# Patient Record
Sex: Male | Born: 1935 | Race: White | Hispanic: No | Marital: Married | Smoking: Former smoker
Health system: Southern US, Community
[De-identification: ages and names within clinical notes are randomized; demographics above are authoritative.]

## PROBLEM LIST (undated history)

## (undated) DIAGNOSIS — I499 Cardiac arrhythmia, unspecified: Secondary | ICD-10-CM

## (undated) DIAGNOSIS — I509 Heart failure, unspecified: Secondary | ICD-10-CM

## (undated) DIAGNOSIS — I10 Essential (primary) hypertension: Secondary | ICD-10-CM

## (undated) HISTORY — PX: CORONARY STENT PLACEMENT: SHX1402

## (undated) HISTORY — PX: LEG AMPUTATION ABOVE KNEE: SHX117

---

## 1998-09-13 ENCOUNTER — Ambulatory Visit (HOSPITAL_COMMUNITY): Admission: RE | Admit: 1998-09-13 | Discharge: 1998-09-13 | Payer: Self-pay | Admitting: *Deleted

## 1998-09-13 ENCOUNTER — Encounter: Payer: Self-pay | Admitting: *Deleted

## 1999-04-05 ENCOUNTER — Encounter: Payer: Self-pay | Admitting: Emergency Medicine

## 1999-04-05 ENCOUNTER — Emergency Department (HOSPITAL_COMMUNITY): Admission: EM | Admit: 1999-04-05 | Discharge: 1999-04-05 | Payer: Self-pay | Admitting: *Deleted

## 1999-04-06 ENCOUNTER — Encounter: Payer: Self-pay | Admitting: Urology

## 1999-04-06 ENCOUNTER — Inpatient Hospital Stay (HOSPITAL_COMMUNITY): Admission: EM | Admit: 1999-04-06 | Discharge: 1999-04-12 | Payer: Self-pay | Admitting: Emergency Medicine

## 1999-04-08 ENCOUNTER — Encounter: Payer: Self-pay | Admitting: Urology

## 1999-04-11 ENCOUNTER — Encounter: Payer: Self-pay | Admitting: Internal Medicine

## 1999-04-11 ENCOUNTER — Encounter: Payer: Self-pay | Admitting: Urology

## 1999-04-17 ENCOUNTER — Encounter: Payer: Self-pay | Admitting: Urology

## 1999-04-17 ENCOUNTER — Ambulatory Visit (HOSPITAL_COMMUNITY): Admission: RE | Admit: 1999-04-17 | Discharge: 1999-04-17 | Payer: Self-pay | Admitting: Urology

## 1999-04-29 ENCOUNTER — Encounter: Payer: Self-pay | Admitting: Urology

## 1999-04-29 ENCOUNTER — Inpatient Hospital Stay (HOSPITAL_COMMUNITY): Admission: RE | Admit: 1999-04-29 | Discharge: 1999-05-08 | Payer: Self-pay | Admitting: Urology

## 1999-05-01 ENCOUNTER — Encounter: Payer: Self-pay | Admitting: Urology

## 1999-05-02 ENCOUNTER — Encounter (HOSPITAL_BASED_OUTPATIENT_CLINIC_OR_DEPARTMENT_OTHER): Payer: Self-pay | Admitting: Internal Medicine

## 1999-05-21 ENCOUNTER — Encounter (HOSPITAL_COMMUNITY): Admission: RE | Admit: 1999-05-21 | Discharge: 1999-08-19 | Payer: Self-pay | Admitting: Plastic Surgery

## 1999-06-05 ENCOUNTER — Ambulatory Visit: Admission: RE | Admit: 1999-06-05 | Discharge: 1999-06-05 | Payer: Self-pay | Admitting: Internal Medicine

## 1999-08-07 ENCOUNTER — Inpatient Hospital Stay (HOSPITAL_COMMUNITY): Admission: RE | Admit: 1999-08-07 | Discharge: 1999-08-11 | Payer: Self-pay | Admitting: Orthopaedic Surgery

## 2000-05-24 ENCOUNTER — Inpatient Hospital Stay (HOSPITAL_COMMUNITY): Admission: EM | Admit: 2000-05-24 | Discharge: 2000-05-27 | Payer: Self-pay | Admitting: *Deleted

## 2000-09-23 ENCOUNTER — Ambulatory Visit (HOSPITAL_COMMUNITY): Admission: RE | Admit: 2000-09-23 | Discharge: 2000-09-23 | Payer: Self-pay | Admitting: Cardiology

## 2000-11-17 ENCOUNTER — Ambulatory Visit (HOSPITAL_COMMUNITY): Admission: RE | Admit: 2000-11-17 | Discharge: 2000-11-18 | Payer: Self-pay | Admitting: *Deleted

## 2001-03-18 ENCOUNTER — Inpatient Hospital Stay (HOSPITAL_COMMUNITY): Admission: EM | Admit: 2001-03-18 | Discharge: 2001-03-22 | Payer: Self-pay | Admitting: *Deleted

## 2002-08-29 ENCOUNTER — Encounter: Payer: Self-pay | Admitting: Emergency Medicine

## 2002-08-29 ENCOUNTER — Observation Stay (HOSPITAL_COMMUNITY): Admission: EM | Admit: 2002-08-29 | Discharge: 2002-08-30 | Payer: Self-pay | Admitting: Emergency Medicine

## 2002-08-30 ENCOUNTER — Encounter: Payer: Self-pay | Admitting: *Deleted

## 2005-10-24 ENCOUNTER — Inpatient Hospital Stay (HOSPITAL_COMMUNITY): Admission: EM | Admit: 2005-10-24 | Discharge: 2005-11-05 | Payer: Self-pay | Admitting: Emergency Medicine

## 2005-10-24 ENCOUNTER — Ambulatory Visit: Payer: Self-pay | Admitting: Emergency Medicine

## 2005-10-24 ENCOUNTER — Ambulatory Visit: Payer: Self-pay | Admitting: Internal Medicine

## 2005-10-29 ENCOUNTER — Encounter (INDEPENDENT_AMBULATORY_CARE_PROVIDER_SITE_OTHER): Payer: Self-pay | Admitting: Interventional Cardiology

## 2007-12-21 ENCOUNTER — Encounter: Admission: RE | Admit: 2007-12-21 | Discharge: 2007-12-21 | Payer: Self-pay | Admitting: Geriatric Medicine

## 2008-01-11 ENCOUNTER — Encounter: Admission: RE | Admit: 2008-01-11 | Discharge: 2008-01-11 | Payer: Self-pay | Admitting: Urology

## 2008-10-01 ENCOUNTER — Encounter (HOSPITAL_COMMUNITY): Admission: RE | Admit: 2008-10-01 | Discharge: 2008-12-30 | Payer: Self-pay | Admitting: Cardiology

## 2009-05-05 ENCOUNTER — Inpatient Hospital Stay (HOSPITAL_COMMUNITY): Admission: EM | Admit: 2009-05-05 | Discharge: 2009-05-09 | Payer: Self-pay | Admitting: Emergency Medicine

## 2009-05-27 ENCOUNTER — Inpatient Hospital Stay (HOSPITAL_COMMUNITY): Admission: EM | Admit: 2009-05-27 | Discharge: 2009-06-03 | Payer: Self-pay | Admitting: Emergency Medicine

## 2009-05-29 ENCOUNTER — Encounter (INDEPENDENT_AMBULATORY_CARE_PROVIDER_SITE_OTHER): Payer: Self-pay | Admitting: Internal Medicine

## 2009-07-04 ENCOUNTER — Inpatient Hospital Stay (HOSPITAL_COMMUNITY): Admission: EM | Admit: 2009-07-04 | Discharge: 2009-07-09 | Payer: Self-pay | Admitting: Emergency Medicine

## 2009-08-01 ENCOUNTER — Encounter (HOSPITAL_COMMUNITY): Admission: RE | Admit: 2009-08-01 | Discharge: 2009-10-16 | Payer: Self-pay | Admitting: Nephrology

## 2009-08-01 ENCOUNTER — Encounter (INDEPENDENT_AMBULATORY_CARE_PROVIDER_SITE_OTHER): Payer: Self-pay | Admitting: *Deleted

## 2009-12-14 ENCOUNTER — Inpatient Hospital Stay (HOSPITAL_COMMUNITY): Admission: EM | Admit: 2009-12-14 | Discharge: 2009-12-18 | Payer: Self-pay | Admitting: Emergency Medicine

## 2010-01-02 ENCOUNTER — Ambulatory Visit: Payer: Self-pay | Admitting: Family Medicine

## 2010-01-02 ENCOUNTER — Inpatient Hospital Stay (HOSPITAL_COMMUNITY): Admission: EM | Admit: 2010-01-02 | Discharge: 2010-01-09 | Payer: Self-pay | Admitting: Emergency Medicine

## 2010-01-02 ENCOUNTER — Ambulatory Visit: Payer: Self-pay | Admitting: Vascular Surgery

## 2010-01-02 ENCOUNTER — Encounter (INDEPENDENT_AMBULATORY_CARE_PROVIDER_SITE_OTHER): Payer: Self-pay | Admitting: Emergency Medicine

## 2010-01-17 ENCOUNTER — Encounter (INDEPENDENT_AMBULATORY_CARE_PROVIDER_SITE_OTHER): Payer: Self-pay | Admitting: Cardiology

## 2010-01-17 ENCOUNTER — Emergency Department (HOSPITAL_COMMUNITY): Admission: EM | Admit: 2010-01-17 | Discharge: 2010-01-17 | Payer: Self-pay | Admitting: Emergency Medicine

## 2010-01-17 ENCOUNTER — Ambulatory Visit: Payer: Self-pay | Admitting: Internal Medicine

## 2010-03-26 ENCOUNTER — Inpatient Hospital Stay (HOSPITAL_COMMUNITY)
Admission: EM | Admit: 2010-03-26 | Discharge: 2010-03-29 | Payer: Self-pay | Source: Home / Self Care | Attending: Internal Medicine | Admitting: Internal Medicine

## 2010-03-30 ENCOUNTER — Encounter: Payer: Self-pay | Admitting: Geriatric Medicine

## 2010-03-31 ENCOUNTER — Encounter: Payer: Self-pay | Admitting: Orthopaedic Surgery

## 2010-03-31 LAB — BASIC METABOLIC PANEL
CO2: 25 mEq/L (ref 19–32)
CO2: 27 mEq/L (ref 19–32)
Calcium: 8 mg/dL — ABNORMAL LOW (ref 8.4–10.5)
Chloride: 110 mEq/L (ref 96–112)
Chloride: 109 mEq/L (ref 96–112)
Creatinine, Ser: 2.23 mg/dL — ABNORMAL HIGH (ref 0.4–1.5)
Creatinine, Ser: 2.33 mg/dL — ABNORMAL HIGH (ref 0.4–1.5)
GFR calc Af Amer: 34 mL/min — ABNORMAL LOW (ref >60)
Glucose, Bld: 41 mg/dL — CL (ref 70–99)
Glucose, Bld: 193 mg/dL — ABNORMAL HIGH (ref 70–99)

## 2010-03-31 LAB — CARDIAC PANEL(CRET KIN+CKTOT+MB+TROPI)
CK, MB: 4.3 ng/mL — ABNORMAL HIGH (ref 0.3–4.0)
Relative Index: INVALID (ref 0.0–2.5)
Relative Index: INVALID (ref 0.0–2.5)
Total CK: 32 U/L (ref 7–232)
Troponin I: 0.03 ng/mL (ref 0.00–0.06)

## 2010-03-31 LAB — COMPREHENSIVE METABOLIC PANEL
Albumin: 2 g/dL — ABNORMAL LOW (ref 3.5–5.2)
BUN: 59 mg/dL — ABNORMAL HIGH (ref 6–23)
Calcium: 7.9 mg/dL — ABNORMAL LOW (ref 8.4–10.5)
Creatinine, Ser: 2.27 mg/dL — ABNORMAL HIGH (ref 0.4–1.5)
GFR calc Af Amer: 33 mL/min — ABNORMAL LOW (ref >60)
Total Bilirubin: 0.3 mg/dL (ref 0.3–1.2)
Total Protein: 5.9 g/dL — ABNORMAL LOW (ref 6.0–8.3)

## 2010-03-31 LAB — GLUCOSE, CAPILLARY
Glucose-Capillary: 171 mg/dL — ABNORMAL HIGH (ref 70–99)
Glucose-Capillary: 130 mg/dL — ABNORMAL HIGH (ref 70–99)
Glucose-Capillary: 187 mg/dL — ABNORMAL HIGH (ref 70–99)
Glucose-Capillary: 201 mg/dL — ABNORMAL HIGH (ref 70–99)
Glucose-Capillary: 193 mg/dL — ABNORMAL HIGH (ref 70–99)

## 2010-03-31 LAB — DIFFERENTIAL
Basophils Absolute: 0.2 10*3/uL — ABNORMAL HIGH (ref 0.0–0.1)
Lymphocytes Relative: 15 % (ref 12–46)
Lymphs Abs: 1.5 10*3/uL (ref 0.7–4.0)
Neutro Abs: 7.1 10*3/uL (ref 1.7–7.7)

## 2010-03-31 LAB — CBC
HCT: 28.3 % — ABNORMAL LOW (ref 39.0–52.0)
Hemoglobin: 9.1 g/dL — ABNORMAL LOW (ref 13.0–17.0)
MCH: 31.9 pg (ref 26.0–34.0)
MCHC: 32.4 g/dL (ref 30.0–36.0)
MCV: 97.3 fL (ref 78.0–100.0)
Platelets: 234 10*3/uL (ref 150–400)
RDW: 15.3 % (ref 11.5–15.5)
WBC: 9.8 10*3/uL (ref 4.0–10.5)

## 2010-03-31 LAB — MAGNESIUM: Magnesium: 1.9 mg/dL (ref 1.5–2.5)

## 2010-03-31 LAB — CK TOTAL AND CKMB (NOT AT ARMC)
CK, MB: 4.6 ng/mL — ABNORMAL HIGH (ref 0.3–4.0)
Relative Index: INVALID (ref 0.0–2.5)
Total CK: 37 U/L (ref 7–232)

## 2010-03-31 LAB — PROTIME-INR: INR: 2.53 — ABNORMAL HIGH (ref 0.00–1.49)

## 2010-03-31 LAB — BRAIN NATRIURETIC PEPTIDE: Pro B Natriuretic peptide (BNP): 900 pg/mL — ABNORMAL HIGH (ref 0.0–100.0)

## 2010-04-01 LAB — BASIC METABOLIC PANEL
CO2: 29 mEq/L (ref 19–32)
Calcium: 7.8 mg/dL — ABNORMAL LOW (ref 8.4–10.5)
Chloride: 105 mEq/L (ref 96–112)
GFR calc Af Amer: 33 mL/min — ABNORMAL LOW (ref >60)
Glucose, Bld: 132 mg/dL — ABNORMAL HIGH (ref 70–99)
Sodium: 140 mEq/L (ref 135–145)

## 2010-04-01 LAB — GLUCOSE, CAPILLARY: Glucose-Capillary: 102 mg/dL — ABNORMAL HIGH (ref 70–99)

## 2010-04-01 LAB — PROTIME-INR: INR: 1.74 — ABNORMAL HIGH (ref 0.00–1.49)

## 2010-04-07 NOTE — Discharge Summary (Addendum)
NAME:  William French, William French             ACCOUNT NO.:  [id number]  MEDICAL RECORD NO.:  [id number]          PATIENT TYPE:  INP  LOCATION:  4703                         FACILITY:  [facility]  PHYSICIAN:  Peggye Pitt, M.D. DATE OF BIRTH:  1923/03/21  DATE OF ADMISSION:  03/26/2010 DATE OF DISCHARGE:  03/29/2010                              DISCHARGE SUMMARY   PRIMARY CARE PHYSICIAN:  Drue Dun. Tripp, MD  DISCHARGE DIAGNOSES: 1. Acute-on-chronic diastolic congestive heart failure exacerbation,     believed secondary to dietary indiscretion. 2. 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 3. Recent history of pulmonary embolism. 4. Paroxysmal atrial fibrillation, rate controlled. 5. Diabetes mellitus type 2. 6. Status post left above-knee amputation. 7. Hypothyroidism. 8. Status post right intertrochanteric hip fracture, status post     repair in October 2011.  DISCHARGE MEDICATIONS: 1. Ensure twice daily. 2. Lasix 40 mg twice daily. 3. Multivitamin 1 tablet daily. 4. Norvasc 10 mg daily. 5. Aspirin 325 mg daily. 6. Regular insulin sliding scale per his PCP. 7. Hydralazine 25 mg 3 times a day. 8. Synthroid 125 mcg daily. 9. Lantus 60 units subcutaneously in the morning. 10.Labetalol 5 mg daily. 11.Oxycodone 5 mg 1 tablet daily as needed for pain. 12.Protonix 40 mg 2 tablets daily. 13.Warfarin 1 mg to take half a tablet daily.  DISPOSITION AND FOLLOWUP:  William French will be discharged home today in stable condition.  He has been given instructions regarding low-salt diet.  He is to follow up with his primary care physician, Dr. Florentina Jenny in approximately 2 weeks after discharge for followup on INR levels as well as CHF.  William French will be discharged on his home regimen of O2, which is 2-2.5 liters.  CONSULTATIONS:  None.  IMAGES AND PROCEDURES:  A chest x-ray on March 26, 2010, that showed CHF with pleural effusions.  Repeat chest x-ray on March 28, 2010, that showed stable  bilateral effusions with improvement in his CHF.  HISTORY AND PHYSICAL:  For full details, see dictation by Dr. Berkley Harvey on March 26, 2010, but in brief, William French is a pleasant 77 year old Caucasian gentleman with a history of diastolic CHF who presented to the hospital with worsening shortness of breath.  He was recently admitted to our hospital in November for an acute decompensation of his diastolic CHF.  He states that the shortness of breath is very apparent even with minimal exertion, such as getting on the chair.  He is wheelchair bound secondary to a left amputation and a right hip fracture.  He does not have any chest pain.  Because of these issues, we were asked to admit him for further evaluation.  HOSPITAL COURSE BY PROBLEM: 1. Acute-on-chronic diastolic CHF.  His Lasix dose was increased while     in the hospital.  He has diuresed approximately 6 liters and has     lost 5 kg since admission.  He has been given information on the     medication compliance and a diet adherence. 2. A Stage III to IV chronic kidney disease.  His creatinine has     maintained at his baseline between 2.2  and 2.3. 3. For his recent PE, he is maintained on Coumadin. 4. For his atrial fibrillation, he is rate controlled.  Please note     that he is on Coumadin for his PE, but after his 6 months are     completed, he has already been deemed an appropriate long-term     Coumadin candidate secondary to his mobility status in regard to is     AFib. 5. For his diabetes, his sugars have been well controlled while in the     hospital, his home regimen of Lantus 6 units on sliding scale. 6. All the rest of his chronic conditions have been stable.  VITAL SIGNS ON DAY OF DISCHARGE:  Blood pressure 167/60, heart rate 65, respirations 10, sats of 97% on 2 liters with a temperature of 98.1.     Peggye Pitt, M.D.     EH/MEDQ  D:  03/29/2010  T:  03/30/2010  Job:  [id number]  cc:   Drue Dun. [employer],  MD  Electronically Signed by Peggye Pitt M.D. on 04/07/2010 07:57:17 AM

## 2010-04-10 NOTE — Letter (Signed)
Summary: New Patient letter  [facility]  [address] [place], [place] [address]   Phone: [phone]  Fax: [phone]       08/01/2009 MRN: [id number]  William French [address] [place], [place]  [address]  Dear Mr. Lamarca,  Welcome to the Gastroenterology Division at [facility].    You are scheduled to see Dr.  Christella Hartigan on 09-06-09 at 10:30a.m. on the 3rd floor at [facility], [address].  We ask that you try to arrive at our office 15 minutes prior to your appointment time to allow for check-in.  We would like you to complete the enclosed self-administered evaluation form prior to your visit and bring it with you on the day of your appointment.  We will review it with you.  Also, please bring a complete list of all your medications or, if you prefer, bring the medication bottles and we will list them.  Please bring your insurance card so that we may make a copy of it.  If your insurance requires a referral to see a specialist, please bring your referral form from your primary care physician.  Co-payments are due at the time of your visit and may be paid by cash, check or credit card.     Your office visit will consist of a consult with your physician (includes a physical exam), any laboratory testing he/she may order, scheduling of any necessary diagnostic testing (e.g. x-ray, ultrasound, CT-scan), and scheduling of a procedure (e.g. Endoscopy, Colonoscopy) if required.  Please allow enough time on your schedule to allow for any/all of these possibilities.    If you cannot keep your appointment, please call [phone] to cancel or reschedule prior to your appointment date.  This allows [place] the opportunity to schedule an appointment for another patient in need of care.  If you do not cancel or reschedule by 5 p.m. the business day prior to your appointment date, you will be charged a $50.00 late cancellation/no-show fee.    Thank you for choosing  [facility] for your medical needs.  We appreciate the opportunity to care for you.  Please visit [place] at our website  to learn more about our practice.                     Sincerely,                                                             The Gastroenterology Division

## 2010-05-07 NOTE — Discharge Summary (Signed)
  NAME:  William French, William French             ACCOUNT NO.:  [id number]  MEDICAL RECORD NO.:  [id number]          PATIENT TYPE:  INP  LOCATION:  4703                         FACILITY:  [facility]  PHYSICIAN:  Peggye Pitt, M.D. DATE OF BIRTH:  November 02, 1923  DATE OF ADMISSION:  03/26/2010 DATE OF DISCHARGE:  03/29/2010                              DISCHARGE SUMMARY   ADDENDUM Medication #10 that currently reads as labetalol 5 mg daily, should instead be nebivolol 5 mg daily.  Rest of discharge diagnosis medications remains as previously dictated summary.     Peggye Pitt, M.D.     EH/MEDQ  D:  04/29/2010  T:  04/30/2010  Job:  [id number]  Electronically Signed by Peggye Pitt M.D. on 05/07/2010 08:20:43 AM

## 2010-05-20 LAB — CBC
HCT: 28.2 % — ABNORMAL LOW (ref 39.0–52.0)
HCT: 30 % — ABNORMAL LOW (ref 39.0–52.0)
Hemoglobin: 9 g/dL — ABNORMAL LOW (ref 13.0–17.0)
Hemoglobin: 9.2 g/dL — ABNORMAL LOW (ref 13.0–17.0)
MCH: 31.1 pg (ref 26.0–34.0)
MCHC: 31.9 g/dL (ref 30.0–36.0)
MCHC: 32.1 g/dL (ref 30.0–36.0)
MCV: 97.6 fL (ref 78.0–100.0)
MCV: 99.3 fL (ref 78.0–100.0)
RBC: 3.02 MIL/uL — ABNORMAL LOW (ref 4.22–5.81)
RDW: 17 % — ABNORMAL HIGH (ref 11.5–15.5)
RDW: 17 % — ABNORMAL HIGH (ref 11.5–15.5)
RDW: 17.1 % — ABNORMAL HIGH (ref 11.5–15.5)
WBC: 8.8 10*3/uL (ref 4.0–10.5)
WBC: 9.7 10*3/uL (ref 4.0–10.5)

## 2010-05-20 LAB — BASIC METABOLIC PANEL
BUN: 42 mg/dL — ABNORMAL HIGH (ref 6–23)
BUN: 46 mg/dL — ABNORMAL HIGH (ref 6–23)
BUN: 49 mg/dL — ABNORMAL HIGH (ref 6–23)
CO2: 23 mEq/L (ref 19–32)
Calcium: 7.7 mg/dL — ABNORMAL LOW (ref 8.4–10.5)
Chloride: 110 mEq/L (ref 96–112)
Chloride: 117 mEq/L — ABNORMAL HIGH (ref 96–112)
Creatinine, Ser: 2.37 mg/dL — ABNORMAL HIGH (ref 0.4–1.5)
GFR calc Af Amer: 32 mL/min — ABNORMAL LOW (ref >60)
GFR calc Af Amer: 35 mL/min — ABNORMAL LOW (ref >60)
GFR calc non Af Amer: 26 mL/min — ABNORMAL LOW (ref >60)
GFR calc non Af Amer: 27 mL/min — ABNORMAL LOW (ref >60)
GFR calc non Af Amer: 29 mL/min — ABNORMAL LOW (ref >60)
Glucose, Bld: 142 mg/dL — ABNORMAL HIGH (ref 70–99)
Potassium: 4.4 mEq/L (ref 3.5–5.1)
Potassium: 4.5 mEq/L (ref 3.5–5.1)
Sodium: 141 mEq/L (ref 135–145)
Sodium: 141 mEq/L (ref 135–145)

## 2010-05-20 LAB — PROTIME-INR
INR: 1.92 — ABNORMAL HIGH (ref 0.00–1.49)
INR: 2.01 — ABNORMAL HIGH (ref 0.00–1.49)
INR: 2.18 — ABNORMAL HIGH (ref 0.00–1.49)
Prothrombin Time: 22.1 seconds — ABNORMAL HIGH (ref 11.6–15.2)
Prothrombin Time: 29.6 seconds — ABNORMAL HIGH (ref 11.6–15.2)

## 2010-05-20 LAB — POCT I-STAT, CHEM 8
Calcium, Ion: 1.08 mmol/L — ABNORMAL LOW (ref 1.12–1.32)
HCT: 30 % — ABNORMAL LOW (ref 39.0–52.0)
Hemoglobin: 10.2 g/dL — ABNORMAL LOW (ref 13.0–17.0)
Sodium: 141 mEq/L (ref 135–145)
TCO2: 23 mmol/L (ref 0–100)

## 2010-05-20 LAB — GLUCOSE, CAPILLARY
Glucose-Capillary: 182 mg/dL — ABNORMAL HIGH (ref 70–99)
Glucose-Capillary: 192 mg/dL — ABNORMAL HIGH (ref 70–99)
Glucose-Capillary: 224 mg/dL — ABNORMAL HIGH (ref 70–99)
Glucose-Capillary: 273 mg/dL — ABNORMAL HIGH (ref 70–99)
Glucose-Capillary: 387 mg/dL — ABNORMAL HIGH (ref 70–99)

## 2010-05-20 LAB — DIFFERENTIAL
Basophils Absolute: 0.2 10*3/uL — ABNORMAL HIGH (ref 0.0–0.1)
Basophils Relative: 2 % — ABNORMAL HIGH (ref 0–1)
Lymphocytes Relative: 17 % (ref 12–46)
Monocytes Relative: 10 % (ref 3–12)
Neutro Abs: 5.9 10*3/uL (ref 1.7–7.7)
Neutrophils Relative %: 67 % (ref 43–77)

## 2010-05-20 LAB — POCT CARDIAC MARKERS

## 2010-05-20 LAB — CK TOTAL AND CKMB (NOT AT ARMC)
CK, MB: 3.8 ng/mL (ref 0.3–4.0)
Relative Index: INVALID (ref 0.0–2.5)
Total CK: 28 U/L (ref 7–232)

## 2010-05-20 LAB — BRAIN NATRIURETIC PEPTIDE: Pro B Natriuretic peptide (BNP): 1209 pg/mL — ABNORMAL HIGH (ref 0.0–100.0)

## 2010-05-20 LAB — TROPONIN I: Troponin I: 0.02 ng/mL (ref 0.00–0.06)

## 2010-05-21 LAB — GLUCOSE, CAPILLARY
Glucose-Capillary: 113 mg/dL — ABNORMAL HIGH (ref 70–99)
Glucose-Capillary: 131 mg/dL — ABNORMAL HIGH (ref 70–99)
Glucose-Capillary: 134 mg/dL — ABNORMAL HIGH (ref 70–99)
Glucose-Capillary: 136 mg/dL — ABNORMAL HIGH (ref 70–99)
Glucose-Capillary: 143 mg/dL — ABNORMAL HIGH (ref 70–99)
Glucose-Capillary: 148 mg/dL — ABNORMAL HIGH (ref 70–99)
Glucose-Capillary: 158 mg/dL — ABNORMAL HIGH (ref 70–99)
Glucose-Capillary: 208 mg/dL — ABNORMAL HIGH (ref 70–99)
Glucose-Capillary: 213 mg/dL — ABNORMAL HIGH (ref 70–99)
Glucose-Capillary: 81 mg/dL (ref 70–99)
Glucose-Capillary: 92 mg/dL (ref 70–99)
Glucose-Capillary: 120 mg/dL — ABNORMAL HIGH (ref 70–99)
Glucose-Capillary: 123 mg/dL — ABNORMAL HIGH (ref 70–99)
Glucose-Capillary: 127 mg/dL — ABNORMAL HIGH (ref 70–99)
Glucose-Capillary: 137 mg/dL — ABNORMAL HIGH (ref 70–99)
Glucose-Capillary: 169 mg/dL — ABNORMAL HIGH (ref 70–99)
Glucose-Capillary: 176 mg/dL — ABNORMAL HIGH (ref 70–99)
Glucose-Capillary: 185 mg/dL — ABNORMAL HIGH (ref 70–99)
Glucose-Capillary: 202 mg/dL — ABNORMAL HIGH (ref 70–99)
Glucose-Capillary: 203 mg/dL — ABNORMAL HIGH (ref 70–99)
Glucose-Capillary: 55 mg/dL — ABNORMAL LOW (ref 70–99)
Glucose-Capillary: 86 mg/dL (ref 70–99)

## 2010-05-21 LAB — DIFFERENTIAL
Basophils Absolute: 0.3 10*3/uL — ABNORMAL HIGH (ref 0.0–0.1)
Basophils Relative: 1 % (ref 0–1)
Eosinophils Absolute: 0.2 10*3/uL (ref 0.0–0.7)
Eosinophils Relative: 4 % (ref 0–5)
Eosinophils Relative: 3 % (ref 0–5)
Lymphocytes Relative: 16 % (ref 12–46)
Lymphs Abs: 1.3 10*3/uL (ref 0.7–4.0)
Monocytes Absolute: 1.2 10*3/uL — ABNORMAL HIGH (ref 0.1–1.0)
Monocytes Relative: 13 % — ABNORMAL HIGH (ref 3–12)
Neutro Abs: 7 10*3/uL (ref 1.7–7.7)
Neutrophils Relative %: 69 % (ref 43–77)
Neutrophils Relative %: 71 % (ref 43–77)

## 2010-05-21 LAB — CBC
HCT: 23.8 % — ABNORMAL LOW (ref 39.0–52.0)
HCT: 27.3 % — ABNORMAL LOW (ref 39.0–52.0)
HCT: 27.9 % — ABNORMAL LOW (ref 39.0–52.0)
Hemoglobin: 9 g/dL — ABNORMAL LOW (ref 13.0–17.0)
Hemoglobin: 7.5 g/dL — ABNORMAL LOW (ref 13.0–17.0)
MCH: 31.4 pg (ref 26.0–34.0)
MCH: 31.6 pg (ref 26.0–34.0)
MCH: 32.1 pg (ref 26.0–34.0)
MCH: 31.6 pg (ref 26.0–34.0)
MCHC: 32.1 g/dL (ref 30.0–36.0)
MCHC: 33.2 g/dL (ref 30.0–36.0)
MCV: 97.9 fL (ref 78.0–100.0)
MCV: 98.9 fL (ref 78.0–100.0)
MCV: 99.6 fL (ref 78.0–100.0)
MCV: 99.6 fL (ref 78.0–100.0)
MCV: 95.4 fL (ref 78.0–100.0)
Platelets: 264 10*3/uL (ref 150–400)
Platelets: 291 10*3/uL (ref 150–400)
Platelets: 322 10*3/uL (ref 150–400)
Platelets: 410 10*3/uL — ABNORMAL HIGH (ref 150–400)
Platelets: 257 10*3/uL (ref 150–400)
RBC: 2.39 MIL/uL — ABNORMAL LOW (ref 4.22–5.81)
RBC: 2.75 MIL/uL — ABNORMAL LOW (ref 4.22–5.81)
RBC: 2.37 MIL/uL — ABNORMAL LOW (ref 4.22–5.81)
RDW: 16.3 % — ABNORMAL HIGH (ref 11.5–15.5)
RDW: 16.9 % — ABNORMAL HIGH (ref 11.5–15.5)
RDW: 17 % — ABNORMAL HIGH (ref 11.5–15.5)
RDW: 14.8 % (ref 11.5–15.5)
WBC: 10.1 10*3/uL (ref 4.0–10.5)
WBC: 7.4 10*3/uL (ref 4.0–10.5)
WBC: 8 10*3/uL (ref 4.0–10.5)
WBC: 8.3 10*3/uL (ref 4.0–10.5)
WBC: 8.7 10*3/uL (ref 4.0–10.5)
WBC: 10.4 10*3/uL (ref 4.0–10.5)

## 2010-05-21 LAB — BASIC METABOLIC PANEL
BUN: 48 mg/dL — ABNORMAL HIGH (ref 6–23)
BUN: 50 mg/dL — ABNORMAL HIGH (ref 6–23)
BUN: 54 mg/dL — ABNORMAL HIGH (ref 6–23)
BUN: 61 mg/dL — ABNORMAL HIGH (ref 6–23)
BUN: 68 mg/dL — ABNORMAL HIGH (ref 6–23)
CO2: 20 mEq/L (ref 19–32)
CO2: 22 mEq/L (ref 19–32)
CO2: 18 mEq/L — ABNORMAL LOW (ref 19–32)
CO2: 19 mEq/L (ref 19–32)
Calcium: 7.6 mg/dL — ABNORMAL LOW (ref 8.4–10.5)
Calcium: 7.2 mg/dL — ABNORMAL LOW (ref 8.4–10.5)
Calcium: 7.2 mg/dL — ABNORMAL LOW (ref 8.4–10.5)
Calcium: 7.3 mg/dL — ABNORMAL LOW (ref 8.4–10.5)
Chloride: 115 mEq/L — ABNORMAL HIGH (ref 96–112)
Chloride: 117 mEq/L — ABNORMAL HIGH (ref 96–112)
Chloride: 117 mEq/L — ABNORMAL HIGH (ref 96–112)
Chloride: 108 mEq/L (ref 96–112)
Creatinine, Ser: 2.54 mg/dL — ABNORMAL HIGH (ref 0.4–1.5)
Creatinine, Ser: 2.61 mg/dL — ABNORMAL HIGH (ref 0.4–1.5)
Creatinine, Ser: 2.65 mg/dL — ABNORMAL HIGH (ref 0.4–1.5)
Creatinine, Ser: 2.93 mg/dL — ABNORMAL HIGH (ref 0.4–1.5)
GFR calc Af Amer: 28 mL/min — ABNORMAL LOW (ref >60)
GFR calc non Af Amer: 23 mL/min — ABNORMAL LOW (ref >60)
GFR calc non Af Amer: 21 mL/min — ABNORMAL LOW (ref >60)
GFR calc non Af Amer: 22 mL/min — ABNORMAL LOW (ref >60)
Glucose, Bld: 144 mg/dL — ABNORMAL HIGH (ref 70–99)
Glucose, Bld: 93 mg/dL (ref 70–99)
Potassium: 4.3 mEq/L (ref 3.5–5.1)
Potassium: 3.9 mEq/L (ref 3.5–5.1)
Sodium: 132 mEq/L — ABNORMAL LOW (ref 135–145)

## 2010-05-21 LAB — URINALYSIS, ROUTINE W REFLEX MICROSCOPIC
Bilirubin Urine: NEGATIVE
Glucose, UA: NEGATIVE mg/dL
Ketones, ur: NEGATIVE mg/dL
Protein, ur: 30 mg/dL — AB
Urobilinogen, UA: 0.2 mg/dL (ref 0.0–1.0)

## 2010-05-21 LAB — CROSSMATCH
ABO/RH(D): A POS
Antibody Screen: NEGATIVE

## 2010-05-21 LAB — CARDIAC PANEL(CRET KIN+CKTOT+MB+TROPI)
Relative Index: 2.4 (ref 0.0–2.5)
Total CK: 311 U/L — ABNORMAL HIGH (ref 7–232)
Total CK: 318 U/L — ABNORMAL HIGH (ref 7–232)
Troponin I: 0.05 ng/mL (ref 0.00–0.06)
Troponin I: 0.05 ng/mL (ref 0.00–0.06)

## 2010-05-21 LAB — TSH
TSH: 0.628 u[IU]/mL (ref 0.350–4.500)
TSH: 1.01 u[IU]/mL (ref 0.350–4.500)

## 2010-05-21 LAB — PROTIME-INR
INR: 1.34 (ref 0.00–1.49)
Prothrombin Time: 16.8 seconds — ABNORMAL HIGH (ref 11.6–15.2)
Prothrombin Time: 17.6 seconds — ABNORMAL HIGH (ref 11.6–15.2)

## 2010-05-21 LAB — MAGNESIUM: Magnesium: 1.8 mg/dL (ref 1.5–2.5)

## 2010-05-21 LAB — URINE CULTURE: Culture  Setup Time: 201110090139

## 2010-05-21 LAB — VITAMIN B12: Vitamin B-12: 1121 pg/mL — ABNORMAL HIGH (ref 211–911)

## 2010-05-21 LAB — TYPE AND SCREEN: Antibody Screen: NEGATIVE

## 2010-05-21 LAB — IRON AND TIBC
Iron: <10 ug/dL — ABNORMAL LOW (ref 42–135)
TIBC: 92 ug/dL — ABNORMAL LOW (ref 215–435)
UIBC: 66 ug/dL
UIBC: 85 ug/dL

## 2010-05-21 LAB — COMPREHENSIVE METABOLIC PANEL
ALT: 17 U/L (ref 0–53)
Albumin: 1.5 g/dL — ABNORMAL LOW (ref 3.5–5.2)
Alkaline Phosphatase: 71 U/L (ref 39–117)
BUN: 69 mg/dL — ABNORMAL HIGH (ref 6–23)
Potassium: 4 mEq/L (ref 3.5–5.1)
Sodium: 137 mEq/L (ref 135–145)
Total Protein: 5.3 g/dL — ABNORMAL LOW (ref 6.0–8.3)

## 2010-05-21 LAB — APTT: aPTT: 34 seconds (ref 24–37)

## 2010-05-21 LAB — TROPONIN I: Troponin I: 0.06 ng/mL (ref 0.00–0.06)

## 2010-05-21 LAB — POCT I-STAT, CHEM 8
BUN: 58 mg/dL — ABNORMAL HIGH (ref 6–23)
Calcium, Ion: 1.13 mmol/L (ref 1.12–1.32)
Chloride: 115 mEq/L — ABNORMAL HIGH (ref 96–112)
Creatinine, Ser: 2.6 mg/dL — ABNORMAL HIGH (ref 0.4–1.5)

## 2010-05-21 LAB — FOLATE
Folate: >20 ng/mL
Folate: >20 ng/mL

## 2010-05-21 LAB — RETICULOCYTES: Retic Count, Absolute: 52 10*3/uL (ref 19.0–186.0)

## 2010-05-21 LAB — CK TOTAL AND CKMB (NOT AT ARMC): Total CK: 278 U/L — ABNORMAL HIGH (ref 7–232)

## 2010-05-21 LAB — URINE MICROSCOPIC-ADD ON

## 2010-05-21 LAB — PREPARE RBC (CROSSMATCH)

## 2010-05-21 LAB — FERRITIN
Ferritin: 204 ng/mL (ref 22–322)
Ferritin: 223 ng/mL (ref 22–322)

## 2010-05-21 LAB — HEMOGLOBIN A1C: Mean Plasma Glucose: 111 mg/dL (ref <117)

## 2010-05-27 LAB — PROTIME-INR
INR: 3.21 — ABNORMAL HIGH (ref 0.00–1.49)
INR: 3.39 — ABNORMAL HIGH (ref 0.00–1.49)
INR: 3.49 — ABNORMAL HIGH (ref 0.00–1.49)
Prothrombin Time: 42.7 seconds — ABNORMAL HIGH (ref 11.6–15.2)
Prothrombin Time: 34 seconds — ABNORMAL HIGH (ref 11.6–15.2)
Prothrombin Time: 34.8 seconds — ABNORMAL HIGH (ref 11.6–15.2)

## 2010-05-27 LAB — GLUCOSE, CAPILLARY
Glucose-Capillary: 105 mg/dL — ABNORMAL HIGH (ref 70–99)
Glucose-Capillary: 126 mg/dL — ABNORMAL HIGH (ref 70–99)
Glucose-Capillary: 127 mg/dL — ABNORMAL HIGH (ref 70–99)
Glucose-Capillary: 128 mg/dL — ABNORMAL HIGH (ref 70–99)
Glucose-Capillary: 129 mg/dL — ABNORMAL HIGH (ref 70–99)
Glucose-Capillary: 149 mg/dL — ABNORMAL HIGH (ref 70–99)
Glucose-Capillary: 162 mg/dL — ABNORMAL HIGH (ref 70–99)
Glucose-Capillary: 70 mg/dL (ref 70–99)
Glucose-Capillary: 120 mg/dL — ABNORMAL HIGH (ref 70–99)
Glucose-Capillary: 120 mg/dL — ABNORMAL HIGH (ref 70–99)
Glucose-Capillary: 139 mg/dL — ABNORMAL HIGH (ref 70–99)
Glucose-Capillary: 191 mg/dL — ABNORMAL HIGH (ref 70–99)
Glucose-Capillary: 247 mg/dL — ABNORMAL HIGH (ref 70–99)
Glucose-Capillary: 99 mg/dL (ref 70–99)

## 2010-05-27 LAB — BASIC METABOLIC PANEL
BUN: 48 mg/dL — ABNORMAL HIGH (ref 6–23)
BUN: 48 mg/dL — ABNORMAL HIGH (ref 6–23)
CO2: 19 mEq/L (ref 19–32)
Calcium: 8 mg/dL — ABNORMAL LOW (ref 8.4–10.5)
Calcium: 8.1 mg/dL — ABNORMAL LOW (ref 8.4–10.5)
Chloride: 116 mEq/L — ABNORMAL HIGH (ref 96–112)
Chloride: 110 mEq/L (ref 96–112)
Creatinine, Ser: 2.87 mg/dL — ABNORMAL HIGH (ref 0.4–1.5)
Creatinine, Ser: 2.87 mg/dL — ABNORMAL HIGH (ref 0.4–1.5)
GFR calc Af Amer: 27 mL/min — ABNORMAL LOW (ref >60)
GFR calc Af Amer: 26 mL/min — ABNORMAL LOW (ref >60)
GFR calc Af Amer: 29 mL/min — ABNORMAL LOW (ref >60)
GFR calc non Af Amer: 23 mL/min — ABNORMAL LOW (ref >60)
GFR calc non Af Amer: 21 mL/min — ABNORMAL LOW (ref >60)
GFR calc non Af Amer: 24 mL/min — ABNORMAL LOW (ref >60)
Glucose, Bld: 115 mg/dL — ABNORMAL HIGH (ref 70–99)
Glucose, Bld: 151 mg/dL — ABNORMAL HIGH (ref 70–99)
Potassium: 3.7 mEq/L (ref 3.5–5.1)
Potassium: 3.8 mEq/L (ref 3.5–5.1)
Potassium: 4.2 mEq/L (ref 3.5–5.1)
Sodium: 140 mEq/L (ref 135–145)
Sodium: 140 mEq/L (ref 135–145)

## 2010-05-27 LAB — URINALYSIS, ROUTINE W REFLEX MICROSCOPIC
Glucose, UA: NEGATIVE mg/dL
Ketones, ur: NEGATIVE mg/dL
Protein, ur: 30 mg/dL — AB
pH: 7 (ref 5.0–8.0)

## 2010-05-27 LAB — COMPREHENSIVE METABOLIC PANEL
ALT: 12 U/L (ref 0–53)
AST: 16 U/L (ref 0–37)
Albumin: 2.6 g/dL — ABNORMAL LOW (ref 3.5–5.2)
Alkaline Phosphatase: 51 U/L (ref 39–117)
Chloride: 111 mEq/L (ref 96–112)
GFR calc Af Amer: 29 mL/min — ABNORMAL LOW (ref >60)
Potassium: 3.6 mEq/L (ref 3.5–5.1)
Sodium: 137 mEq/L (ref 135–145)
Total Protein: 5.8 g/dL — ABNORMAL LOW (ref 6.0–8.3)

## 2010-05-27 LAB — URINE MICROSCOPIC-ADD ON

## 2010-05-27 LAB — CULTURE, BLOOD (ROUTINE X 2)
Culture: NO GROWTH
Culture: NO GROWTH

## 2010-05-27 LAB — HEMOGLOBIN A1C: Mean Plasma Glucose: 131 mg/dL — ABNORMAL HIGH (ref <117)

## 2010-05-27 LAB — CBC
HCT: 25.6 % — ABNORMAL LOW (ref 39.0–52.0)
HCT: 29.4 % — ABNORMAL LOW (ref 39.0–52.0)
Hemoglobin: 8.7 g/dL — ABNORMAL LOW (ref 13.0–17.0)
MCHC: 33.6 g/dL (ref 30.0–36.0)
MCV: 94.8 fL (ref 78.0–100.0)
Platelets: 229 10*3/uL (ref 150–400)
RBC: 2.69 MIL/uL — ABNORMAL LOW (ref 4.22–5.81)
RBC: 2.89 MIL/uL — ABNORMAL LOW (ref 4.22–5.81)
RBC: 3.1 MIL/uL — ABNORMAL LOW (ref 4.22–5.81)
RDW: 16.3 % — ABNORMAL HIGH (ref 11.5–15.5)
WBC: 10.9 10*3/uL — ABNORMAL HIGH (ref 4.0–10.5)
WBC: 10.6 10*3/uL — ABNORMAL HIGH (ref 4.0–10.5)

## 2010-05-27 LAB — DIFFERENTIAL
Eosinophils Absolute: 0.3 10*3/uL (ref 0.0–0.7)
Eosinophils Relative: 3 % (ref 0–5)
Lymphocytes Relative: 19 % (ref 12–46)
Lymphs Abs: 2 10*3/uL (ref 0.7–4.0)
Monocytes Relative: 8 % (ref 3–12)

## 2010-05-29 LAB — CBC
HCT: 34.2 % — ABNORMAL LOW (ref 39.0–52.0)
Hemoglobin: 11.5 g/dL — ABNORMAL LOW (ref 13.0–17.0)
Hemoglobin: 9.6 g/dL — ABNORMAL LOW (ref 13.0–17.0)
MCHC: 32.8 g/dL (ref 30.0–36.0)
MCHC: 33.5 g/dL (ref 30.0–36.0)
MCV: 94.9 fL (ref 78.0–100.0)
Platelets: 237 10*3/uL (ref 150–400)
RDW: 15.4 % (ref 11.5–15.5)
RDW: 15.6 % — ABNORMAL HIGH (ref 11.5–15.5)

## 2010-05-29 LAB — BASIC METABOLIC PANEL
BUN: 31 mg/dL — ABNORMAL HIGH (ref 6–23)
CO2: 18 mEq/L — ABNORMAL LOW (ref 19–32)
Calcium: 7.4 mg/dL — ABNORMAL LOW (ref 8.4–10.5)
Creatinine, Ser: 2.33 mg/dL — ABNORMAL HIGH (ref 0.4–1.5)
Glucose, Bld: 124 mg/dL — ABNORMAL HIGH (ref 70–99)
Sodium: 136 mEq/L (ref 135–145)

## 2010-05-29 LAB — URINE CULTURE

## 2010-05-29 LAB — DIFFERENTIAL
Basophils Absolute: 0.1 10*3/uL (ref 0.0–0.1)
Basophils Relative: 1 % (ref 0–1)
Lymphocytes Relative: 17 % (ref 12–46)
Monocytes Relative: 8 % (ref 3–12)
Neutro Abs: 7.8 10*3/uL — ABNORMAL HIGH (ref 1.7–7.7)
Neutrophils Relative %: 69 % (ref 43–77)

## 2010-05-29 LAB — URINALYSIS, ROUTINE W REFLEX MICROSCOPIC
Glucose, UA: NEGATIVE mg/dL
Specific Gravity, Urine: 1.007 (ref 1.005–1.030)

## 2010-05-29 LAB — CULTURE, BLOOD (ROUTINE X 2): Culture: NO GROWTH

## 2010-05-29 LAB — COMPREHENSIVE METABOLIC PANEL
Alkaline Phosphatase: 136 U/L — ABNORMAL HIGH (ref 39–117)
BUN: 31 mg/dL — ABNORMAL HIGH (ref 6–23)
Creatinine, Ser: 2.4 mg/dL — ABNORMAL HIGH (ref 0.4–1.5)
Glucose, Bld: 113 mg/dL — ABNORMAL HIGH (ref 70–99)
Potassium: 4.5 mEq/L (ref 3.5–5.1)
Total Bilirubin: 0.5 mg/dL (ref 0.3–1.2)
Total Protein: 6.5 g/dL (ref 6.0–8.3)

## 2010-05-29 LAB — HEMOGLOBIN A1C: Mean Plasma Glucose: 143 mg/dL

## 2010-05-29 LAB — URINE MICROSCOPIC-ADD ON

## 2010-05-29 LAB — GLUCOSE, CAPILLARY
Glucose-Capillary: 216 mg/dL — ABNORMAL HIGH (ref 70–99)
Glucose-Capillary: 250 mg/dL — ABNORMAL HIGH (ref 70–99)

## 2010-05-29 LAB — PROTIME-INR: Prothrombin Time: 16.7 seconds — ABNORMAL HIGH (ref 11.6–15.2)

## 2010-05-30 LAB — CBC
HCT: 26.5 % — ABNORMAL LOW (ref 39.0–52.0)
HCT: 26.6 % — ABNORMAL LOW (ref 39.0–52.0)
HCT: 27.2 % — ABNORMAL LOW (ref 39.0–52.0)
HCT: 27.2 % — ABNORMAL LOW (ref 39.0–52.0)
HCT: 28 % — ABNORMAL LOW (ref 39.0–52.0)
HCT: 28.7 % — ABNORMAL LOW (ref 39.0–52.0)
Hemoglobin: 9 g/dL — ABNORMAL LOW (ref 13.0–17.0)
Hemoglobin: 9.1 g/dL — ABNORMAL LOW (ref 13.0–17.0)
Hemoglobin: 9.1 g/dL — ABNORMAL LOW (ref 13.0–17.0)
Hemoglobin: 9.6 g/dL — ABNORMAL LOW (ref 13.0–17.0)
MCHC: 32.9 g/dL (ref 30.0–36.0)
MCHC: 33.2 g/dL (ref 30.0–36.0)
MCHC: 33.5 g/dL (ref 30.0–36.0)
MCV: 95.5 fL (ref 78.0–100.0)
MCV: 95.6 fL (ref 78.0–100.0)
Platelets: 276 10*3/uL (ref 150–400)
Platelets: 288 10*3/uL (ref 150–400)
Platelets: 296 10*3/uL (ref 150–400)
Platelets: 308 10*3/uL (ref 150–400)
Platelets: 316 10*3/uL (ref 150–400)
RBC: 2.84 MIL/uL — ABNORMAL LOW (ref 4.22–5.81)
RBC: 2.91 MIL/uL — ABNORMAL LOW (ref 4.22–5.81)
RBC: 3.01 MIL/uL — ABNORMAL LOW (ref 4.22–5.81)
RBC: 3.27 MIL/uL — ABNORMAL LOW (ref 4.22–5.81)
RDW: 15.4 % (ref 11.5–15.5)
RDW: 15.6 % — ABNORMAL HIGH (ref 11.5–15.5)
RDW: 15.6 % — ABNORMAL HIGH (ref 11.5–15.5)
RDW: 15.8 % — ABNORMAL HIGH (ref 11.5–15.5)
RDW: 15.9 % — ABNORMAL HIGH (ref 11.5–15.5)
WBC: 10.5 10*3/uL (ref 4.0–10.5)
WBC: 9.4 10*3/uL (ref 4.0–10.5)
WBC: 9.6 10*3/uL (ref 4.0–10.5)

## 2010-05-30 LAB — PROTIME-INR
INR: 2.78 — ABNORMAL HIGH (ref 0.00–1.49)
Prothrombin Time: 21.9 seconds — ABNORMAL HIGH (ref 11.6–15.2)

## 2010-05-30 LAB — POCT CARDIAC MARKERS
CKMB, poc: 2.6 ng/mL (ref 1.0–8.0)
Myoglobin, poc: 220 ng/mL (ref 12–200)

## 2010-05-30 LAB — POCT I-STAT, CHEM 8
HCT: 32 % — ABNORMAL LOW (ref 39.0–52.0)
Hemoglobin: 10.9 g/dL — ABNORMAL LOW (ref 13.0–17.0)
Potassium: 4.4 mEq/L (ref 3.5–5.1)
Sodium: 140 mEq/L (ref 135–145)

## 2010-05-30 LAB — GLUCOSE, CAPILLARY
Glucose-Capillary: 101 mg/dL — ABNORMAL HIGH (ref 70–99)
Glucose-Capillary: 120 mg/dL — ABNORMAL HIGH (ref 70–99)
Glucose-Capillary: 138 mg/dL — ABNORMAL HIGH (ref 70–99)
Glucose-Capillary: 147 mg/dL — ABNORMAL HIGH (ref 70–99)
Glucose-Capillary: 162 mg/dL — ABNORMAL HIGH (ref 70–99)
Glucose-Capillary: 163 mg/dL — ABNORMAL HIGH (ref 70–99)
Glucose-Capillary: 173 mg/dL — ABNORMAL HIGH (ref 70–99)
Glucose-Capillary: 193 mg/dL — ABNORMAL HIGH (ref 70–99)
Glucose-Capillary: 206 mg/dL — ABNORMAL HIGH (ref 70–99)
Glucose-Capillary: 229 mg/dL — ABNORMAL HIGH (ref 70–99)
Glucose-Capillary: 234 mg/dL — ABNORMAL HIGH (ref 70–99)
Glucose-Capillary: 244 mg/dL — ABNORMAL HIGH (ref 70–99)
Glucose-Capillary: 309 mg/dL — ABNORMAL HIGH (ref 70–99)
Glucose-Capillary: 339 mg/dL — ABNORMAL HIGH (ref 70–99)
Glucose-Capillary: 64 mg/dL — ABNORMAL LOW (ref 70–99)
Glucose-Capillary: 71 mg/dL (ref 70–99)
Glucose-Capillary: 71 mg/dL (ref 70–99)
Glucose-Capillary: 78 mg/dL (ref 70–99)
Glucose-Capillary: 82 mg/dL (ref 70–99)
Glucose-Capillary: 87 mg/dL (ref 70–99)

## 2010-05-30 LAB — BASIC METABOLIC PANEL
BUN: 44 mg/dL — ABNORMAL HIGH (ref 6–23)
BUN: 48 mg/dL — ABNORMAL HIGH (ref 6–23)
BUN: 49 mg/dL — ABNORMAL HIGH (ref 6–23)
BUN: 49 mg/dL — ABNORMAL HIGH (ref 6–23)
BUN: 51 mg/dL — ABNORMAL HIGH (ref 6–23)
BUN: 57 mg/dL — ABNORMAL HIGH (ref 6–23)
CO2: 19 mEq/L (ref 19–32)
CO2: 22 mEq/L (ref 19–32)
CO2: 22 mEq/L (ref 19–32)
Calcium: 8 mg/dL — ABNORMAL LOW (ref 8.4–10.5)
Calcium: 8.1 mg/dL — ABNORMAL LOW (ref 8.4–10.5)
Chloride: 110 mEq/L (ref 96–112)
Chloride: 115 mEq/L — ABNORMAL HIGH (ref 96–112)
Creatinine, Ser: 2.66 mg/dL — ABNORMAL HIGH (ref 0.4–1.5)
Creatinine, Ser: 2.7 mg/dL — ABNORMAL HIGH (ref 0.4–1.5)
Creatinine, Ser: 2.82 mg/dL — ABNORMAL HIGH (ref 0.4–1.5)
GFR calc Af Amer: 27 mL/min — ABNORMAL LOW (ref >60)
GFR calc Af Amer: 27 mL/min — ABNORMAL LOW (ref >60)
GFR calc Af Amer: 28 mL/min — ABNORMAL LOW (ref >60)
GFR calc non Af Amer: 23 mL/min — ABNORMAL LOW (ref >60)
GFR calc non Af Amer: 23 mL/min — ABNORMAL LOW (ref >60)
GFR calc non Af Amer: 23 mL/min — ABNORMAL LOW (ref >60)
GFR calc non Af Amer: 23 mL/min — ABNORMAL LOW (ref >60)
GFR calc non Af Amer: 23 mL/min — ABNORMAL LOW (ref >60)
Glucose, Bld: 161 mg/dL — ABNORMAL HIGH (ref 70–99)
Glucose, Bld: 208 mg/dL — ABNORMAL HIGH (ref 70–99)
Potassium: 4.2 mEq/L (ref 3.5–5.1)
Potassium: 4.2 mEq/L (ref 3.5–5.1)
Potassium: 4.4 mEq/L (ref 3.5–5.1)
Potassium: 4.5 mEq/L (ref 3.5–5.1)
Potassium: 4.9 mEq/L (ref 3.5–5.1)
Potassium: 4.9 mEq/L (ref 3.5–5.1)
Sodium: 137 mEq/L (ref 135–145)
Sodium: 138 mEq/L (ref 135–145)
Sodium: 140 mEq/L (ref 135–145)
Sodium: 140 mEq/L (ref 135–145)

## 2010-05-30 LAB — URINALYSIS, ROUTINE W REFLEX MICROSCOPIC
Bilirubin Urine: NEGATIVE
Ketones, ur: NEGATIVE mg/dL
Protein, ur: 100 mg/dL — AB
Urobilinogen, UA: 0.2 mg/dL (ref 0.0–1.0)

## 2010-05-30 LAB — CULTURE, BLOOD (ROUTINE X 2)
Culture: NO GROWTH
Culture: NO GROWTH

## 2010-05-30 LAB — URINE CULTURE: Colony Count: >=100000

## 2010-05-30 LAB — BRAIN NATRIURETIC PEPTIDE
Pro B Natriuretic peptide (BNP): 421 pg/mL — ABNORMAL HIGH (ref 0.0–100.0)
Pro B Natriuretic peptide (BNP): 969 pg/mL — ABNORMAL HIGH (ref 0.0–100.0)

## 2010-05-30 LAB — FERRITIN: Ferritin: 97 ng/mL (ref 22–322)

## 2010-05-30 LAB — RETICULOCYTES
RBC.: 2.95 MIL/uL — ABNORMAL LOW (ref 4.22–5.81)
Retic Count, Absolute: 50.2 10*3/uL (ref 19.0–186.0)
Retic Ct Pct: 1.7 % (ref 0.4–3.1)

## 2010-05-30 LAB — MRSA PCR SCREENING: MRSA by PCR: NEGATIVE

## 2010-05-30 LAB — IRON AND TIBC: Iron: 28 ug/dL — ABNORMAL LOW (ref 42–135)

## 2010-06-02 LAB — BASIC METABOLIC PANEL
BUN: 34 mg/dL — ABNORMAL HIGH (ref 6–23)
BUN: 35 mg/dL — ABNORMAL HIGH (ref 6–23)
CO2: 16 mEq/L — ABNORMAL LOW (ref 19–32)
Calcium: 7.6 mg/dL — ABNORMAL LOW (ref 8.4–10.5)
Chloride: 115 mEq/L — ABNORMAL HIGH (ref 96–112)
Creatinine, Ser: 2.48 mg/dL — ABNORMAL HIGH (ref 0.4–1.5)
Creatinine, Ser: 2.51 mg/dL — ABNORMAL HIGH (ref 0.4–1.5)
GFR calc Af Amer: 30 mL/min — ABNORMAL LOW (ref >60)
GFR calc non Af Amer: 25 mL/min — ABNORMAL LOW (ref >60)
GFR calc non Af Amer: 25 mL/min — ABNORMAL LOW (ref >60)
Glucose, Bld: 98 mg/dL (ref 70–99)
Potassium: 4.8 mEq/L (ref 3.5–5.1)
Sodium: 135 mEq/L (ref 135–145)

## 2010-06-02 LAB — CBC
HCT: 30.5 % — ABNORMAL LOW (ref 39.0–52.0)
Hemoglobin: 10.1 g/dL — ABNORMAL LOW (ref 13.0–17.0)
MCHC: 33.3 g/dL (ref 30.0–36.0)
MCHC: 33.5 g/dL (ref 30.0–36.0)
MCV: 96.2 fL (ref 78.0–100.0)
Platelets: 244 10*3/uL (ref 150–400)
Platelets: 250 10*3/uL (ref 150–400)
RBC: 3.17 MIL/uL — ABNORMAL LOW (ref 4.22–5.81)
RDW: 15.8 % — ABNORMAL HIGH (ref 11.5–15.5)
RDW: 16.1 % — ABNORMAL HIGH (ref 11.5–15.5)
WBC: 12.7 10*3/uL — ABNORMAL HIGH (ref 4.0–10.5)

## 2010-06-02 LAB — GLUCOSE, CAPILLARY
Glucose-Capillary: 111 mg/dL — ABNORMAL HIGH (ref 70–99)
Glucose-Capillary: 143 mg/dL — ABNORMAL HIGH (ref 70–99)
Glucose-Capillary: 151 mg/dL — ABNORMAL HIGH (ref 70–99)
Glucose-Capillary: 161 mg/dL — ABNORMAL HIGH (ref 70–99)
Glucose-Capillary: 179 mg/dL — ABNORMAL HIGH (ref 70–99)
Glucose-Capillary: 217 mg/dL — ABNORMAL HIGH (ref 70–99)
Glucose-Capillary: 71 mg/dL (ref 70–99)
Glucose-Capillary: 74 mg/dL (ref 70–99)

## 2010-06-02 LAB — BRAIN NATRIURETIC PEPTIDE: Pro B Natriuretic peptide (BNP): 593 pg/mL — ABNORMAL HIGH (ref 0.0–100.0)
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
## 2010-07-25 NOTE — Discharge Summary (Signed)
NAMEOLIVIA, William French             ACCOUNT NO.:  [id number]   MEDICAL RECORD NO.:  [id number]          PATIENT TYPE:  INP   LOCATION:  2022                         FACILITY:  [facility]   PHYSICIAN:  Armanda Magic, M.D.     DATE OF BIRTH:  [birth year]   DATE OF ADMISSION:  10/24/2005  DATE OF DISCHARGE:  11/05/2005                                 DISCHARGE SUMMARY   DISCHARGE DIAGNOSES:  1. Chest pain, known coronary artery disease with a resultant cardiac      catheterization this admission with resultant medical management.  2. Diabetes mellitus with neuropathy.  3. Elevated blood pressure.  4. Peripheral vascular disease with left below knee amputation.  5. Status post appendectomy, tonsillectomy, spine surgery.   HOSPITAL COURSE:  Mr. Senft is an 77 year old male patient who has known  coronary artery disease, peripheral vascular disease, diabetes mellitus, as  well as a chronic renal insufficiency who was admitted on October 24, 2005  with substernal chest pain.  Ultimately, he did undergo cardiac  catheterization via the right brachial route.  He was found to have the  following:  Left main okay, LAD with luminal irregularities up to 40% to 50%  stenosis in the mid vessel.  Circumflex patent in the proximal stent site.  The OM1 and Om2 both had 40% to 50% stenoses.  RCA had 70% ostial lesion  with a 60% to 70% mid stenosis and there was a 50% distal RCA stenosis after  the PDA.  Dr. Katrinka Blazing felt that the lesions in the right coronary artery have  progressed since last catheterization in 2003.  The patient was then  monitored.  He does have renal insufficiency with a chronic renal creatinine  of 1.8.   The patient's medications were adjusted to decrease the risk for further  renal problems in the setting of contrast lead.  During the patient's  hospitalization, he showed signs of a right lower lobe inner space disease  and he was started on Avalox only during the hospitalization.  He  was also  aggressively diuresed for pulmonary edema.  Ultimately, the patient was seen  by the nephrology service for acute on chronic renal insufficiency with a  creatinine up to 2.6.   Acutely, the patient developed bradycardia, third degree  AV block with  ventricular response at 45 beats per minute.  His Tegretol and Cardizem were  stopped.  He was placed on a temporarily IV dopamine.  Finally, emergency 2-  D echo showed normal LV size and normal wall motion with an EF of 60%.  Right heart appears normal.  No right-sided wall motion abnormalities.  During this time, the pulmonary service was involved in the patient's care.   During the patient's hospitalization, he became volume depleted secondary to  aggressive diuresis due to his pulmonary hypertension.  Eventually, his  volume status was normalized.  During this time, however, he became oliguric  and eventually began to make urine.  He had a renal ultrasound, which showed  no hydronephrosis.  Eventually, the patient was ready to be discharged home  on November 05, 2005.  Prior to discharge, the renal service did make  suggestions about his medications and recommended Lasix 40 mg a day, as well  as a low-potassium diet.  By discharge, his creatinine was 2.0 with a BUN of  51.  White count 9.6, hemoglobin 14.0, hematocrit 40.4, platelets 242.  CK-  MB is negative.  Total cholesterol 82, triglycerides 74, LDL 46, HDL 21.   The patient's discharge medications include:  Norvasc 10 mg a day, Lantus,  baby aspirin daily, hydrocodone p.r.n., Lasix 40 mg a day.  The patient has  been instructed to labetalol and nifedipine, Micardis and Omega-3 at this  point.  He is to clean his cath site with only soap and water.  No  scrubbing.  Remain on a low-fat, low-potassium diet.  Follow up with Dr. Carolanne Grumbling on September 7 at 2:30 p.m.  Also follow up  with Dr. Hyman Hopes on December 07, 2005 at 4 p.m.  He is to call for any questions  or concerns.   Prior to his visit with Dr. Mayford Knife, he is to go to the lab for  lab work on November 10, 2005.      Guy Franco, P.A.      Armanda Magic, M.D.  Electronically Signed    LB/MEDQ  D:  11/18/2005  T:  11/18/2005  Job:  [id number]

## 2010-07-25 NOTE — Consult Note (Signed)
[place]  Patient:    William French, William French                      MRN: [id number] Attending:  Armanda Magic, M.D. CC:         Barbette Hair. Vaughan Basta., M.D.   Consultation Report  CHIEF COMPLAINT:  Chest pain.  HISTORY OF PRESENT ILLNESS:  This 77 year old white male with a history of diabetes mellitus, hypertension, chronic renal insufficiency, lactic acidosis in the past from Glucophage, peripheral vascular disease status post left BKA, also with paraplegia secondary to severe cervical disk disease presented with substernal chest pain over a past 24-hour period. Yesterday, he had pain intermittently all day associated with mild shortness of breath and left arm numbness. He went to bed and felt better when he awoke but then developed recurrent chest pain this morning. In Dr. Barbette Hair. MacArthurs office, he had worsening of his chest pain with EKG showing deeply inverted T waves in the inferolateral leads.  ALLERGIES:  SULFA.  PAST MEDICAL HISTORY:  Significant for non-insulin-dependent diabetes mellitus, peripheral vascular disease status post left BKA, hypertension, chronic renal insufficiency with a creatinine at baseline of 1.5, lactic acidosis from Glucophage, pancreatic insufficiency, paraplegia secondary to cervical disk disease, chronic prostatitis, neurogenic bladder with urostomy and prostatectomy.  SOCIAL HISTORY:  He is married and retired. He denies any tobacco or alcohol use.  FAMILY HISTORY:  His brother had a coronary artery bypass grafting. His father had an MI.  REVIEW OF SYSTEMS:  Negative, except for HPI.  PHYSICAL EXAMINATION:  VITAL SIGNS:  Blood pressure is 160/59, heart rate is 56.  GENERAL:  Well-developed, well-nourished, white male in moderate distress secondary to chest pain.  HEENT:  Pupils equal, round, and reactive to light and accommodation. Extraocular muscles intact bilaterally. No scleral icterus.  Oropharynx is clear.  NECK:  Supple without lymphadenopathy. No bruits. Carotid upstrokes +2 bilaterally.  LUNGS:  Clear to auscultation throughout.  HEART:  Regular rate and rhythm. No murmurs, rubs, or gallops. Normal S1, S2.  ABDOMEN:  Soft, nontender, nondistended with positive bowel sounds. No hepatosplenomegaly.  EXTREMITIES:  No cyanosis, erythema, or edema. Status post left BKA. There is +1 posterior tibial pulse in the right lower extremity.  NEUROLOGICAL:  Alert and oriented x 3.  LABORATORY DATA:  EKG shows sinus bradycardia with T wave inversions deeply in the inferolateral leads.  IMPRESSION: 1. Unstable angina with EKG changes consistent with inferolateral ischemia. 2. Diabetes mellitus. 3. Hypertension. 4. Peripheral vascular disease, status post left below-the-knee amputation.  PLAN:  Admit to CCU. Rule out myocardial infarction with serial enzymes. IV heparin, Integrilin drips, IV nitroglycerin drip, aspirin, oxygen, Plavix 150 mg now, then 75 mg a day, Lopressor 25 mg q.6h. and plan emergent cardiac catheterization today. DD:  05/24/00 TD:  05/24/00 Job: [id number]
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
## 2010-07-25 NOTE — Cardiovascular Report (Signed)
[place]  Patient:    William French, William French                      MRN: [id number] Proc. Date: 05/24/00 Attending:  Myriam Jacobson A. Fraser Din, M.D. CC:         Barbette Hair. Vaughan Basta., M.D.  Armanda Magic, M.D.   Cardiac Catheterization  REFERRING PHYSICIANS:  Barbette Hair. Vaughan Basta., M.D. and Armanda Magic, M.D.  INDICATIONS FOR PROCEDURE:  Ongoing chest pain with T wave inversion in the inferior lateral leads.  DESCRIPTION OF PROCEDURE:  After obtaining written informed consent, the patient was brought to the cardiac catheterization lab on an urgent basis. The right groin was prepped and draped in the usual sterile fashion. Local anesthesia was achieved using 1% Xylocaine.  We were unable to gain access to the right femoral artery despite using a SmartNeedle.  Right femoral access was aborted.  The left brachial region was the prepped and draped in the usual sterile fashion.  Local anesthesia was achieved using 1% Xylocaine. A 6 French brachial sheath was placed into the left brachial artery.  Coronary angiography was performed using a 6 French Cas II and a JLR 3.5 catheter. Left heart catheterization was performed using a 6 French pigtail curved catheter.  All catheter exchange were made over a guidewire.  Following the procedure, the ACT was more than 200.  The patient was transferred to the sixth floor.  The hemostasis sheath will be removed when the ACT is less than 150.  The films are to be reviewed by Dr. Verdis Prime for possible angioplasty.  FINDINGS:  The aortic pressure was 137/55, LV pressure is 134/17.  No gradient noted on pullback.  CORONARY ANGIOGRAPHY:  Right coronary artery revealed initial ostial spasm.  A total of 200 mcg of intracoronary nitroglycerin was provided.  This resulted in improvement of the ostial spasm.  The right coronary artery gave rise to three small RV marginal branches.  PL branch and a PTA branch, right  coronary artery was dominant for the posterior circulation.  There was a long 70% lesion noted in the posterolateral branch.  Left main coronary artery:  The left main coronary artery was short and bifurcated into the left anterior descending and circumflex vessel.  There was a 30% ostial left main disease noted.  Left anterior descending:  The left anterior descending gave rise to a large diagonal #1, went on to end as an apical recurrent branch.  There was 30% proximal disease noted.  Circumflex vessel:  The circumflex vessel was a large vessel, gave rise to a small OM-1.  This was followed by a 70% apple-core lesion, small OM-2, large OM-3, large OM-4.  IMPRESSION:  Borderline disease in the proximal circumflex and posterolateral branch.  The films are to be reviewed with Dr. Katrinka Blazing.  Further intervention is depending on the discretion of Dr. Verdis Prime.  The patient is transferred back to the sixth floor.  He will be provided with intravenous hydration and Mucomyst to minimize renal toxicity from the contrast agent used.  His heparin will be restarted four hours after the hemostasis sheath is removed.  He will be continued with aspirin. DD:  05/24/00 TD:  05/25/00 Job: [id number]

## 2010-07-25 NOTE — Cardiovascular Report (Signed)
NAME:  William French, William French NO.:  [id number]   MEDICAL RECORD NO.:  [id number]          PATIENT TYPE:  INP   LOCATION:  3712                         FACILITY:  [facility]   PHYSICIAN:  Lyn Records, M.D.   DATE OF BIRTH:  [birth year]   DATE OF PROCEDURE:  10/27/2005  DATE OF DISCHARGE:                              CARDIAC CATHETERIZATION   INDICATION FOR PROCEDURE:  Prolonged chest pain, abnormal EKG.   PROCEDURE PERFORMED:  1. Left heart catheterization via right brachial percutaneous approach.  2. Left ventriculography.  3. Coronary angiography.   DESCRIPTION:  After informed consent a brachial sheath was placed in the  right brachial artery using the modified Seldinger technique.  The artery  was punctured twice, entering successfully on the second pass.  Two thousand  units of intra-arterial heparin was administered via the brachial sheath.  We then performed left heart catheterization over an exchange guidewire  advanced from the brachial sheath into the descending aorta just above the  aortic valve.  The 6-French brachial Yancey Flemings #2 catheter was used for left  ventriculography and left coronary angiography.  We then used a Yancey Flemings 6-  [place] #1 catheter for right coronary angiography.  We also used a 6-French  JR-4 right coronary guide catheter for right coronary angiography.  Good  seating was not possible with this catheter.   After reviewing the cineangiograms it was felt that there was moderate to  moderately-severe disease in the right coronary but probably not the cause  of rest pain.  We also noted that the patient had a spade-shaped left  ventricular cavity consistent with apical hypertrophy and this would be  compatible with the patient's abnormal EKG as anterior T-wave inversion is a  common finding in apical hypertrophy.   ACT postprocedure was 252.  The sheath will be pulled in the holding area  after the ACT drops below 175 seconds.   RESULTS:  1. Hemodynamic data:      a.     Left ventricular pressure 131/23.      b.     Aortic pressure 115/83.  2. Left ventriculography:  The left ventricle is spade-shaped.      Contractility is vigorous.  EF is estimated to be 60-70%.  Findings are      compatible with apical hypertrophy.  3. Coronary angiography.      a.     Left main coronary artery:  Calcified; 30% narrowing is noted       proximally.  No catheter damping is noted.      b.     Left anterior descending coronary artery:  The LAD contains       luminal irregularities.  It gives origin to a large first diagonal.       Irregularities are noted throughout the LAD.  No stenoses greater than       50% is seen.  The LAD wraps around the apex.      c.     Circumflex artery:  This is a large vessel that contains a       widely-patent stent in the  proximal vessel.  Beyond this there is       diffuse disease with up to 50% narrowing before the circumflex       bifurcates into two obtuse marginals, with the first obtuse marginal       containing 60% narrowing and the second obtuse marginal 40-50%       narrowing.  No high-grade obstruction is noted in the circumflex.      d.     Right coronary artery:  The right coronary is a dominant vessel       giving the PDA and left ventricular branches.  The right coronary       contains calcium in the mid vessel.  There is ostial 65-75% narrowing.       There is minimal catheter damping with deep engagement using the       [employer] catheter.  In the mid vessel there is progression to a 60-70%       lesion when compared to the prior study from 2003.  The distal right       coronary beyond the PDA contains 50% narrowing.  This appears to be       less severely involved than prior.  The PDA ostium contains 40%       narrowing.   CONCLUSIONS:  1. Moderate coronary artery disease involving at this time predominantly      the right coronary with borderline significant ostial and mid right       coronary lesions.  The stent in the proximal circumflex is widely      patent with moderate disease beyond the stent.  There is also a luminal      irregularity noted in the left anterior descending coronary artery and      30-40% left main.  2. Spade-shaped left ventricle with vigorous contractility compatible with      apical hypertrophic cardiomyopathy.  This would account for the      abnormal EKG.   PLAN:  1. Aggressive risk factor modification.  2. Consider percutaneous intervention on the right coronary if symptoms      progress and/or demonstration of inferior ischemia.  This procedure      would carry increased risk because it would be a brachial procedure.      It would probably be best carried out from the left brachial to allow      better guide seating in the right coronary.  At this time, however, it      would appear prudent to aggressively treat the patient medically and      intervene only if we are unable to control symptoms.  The other concern      is the patient's impaired renal function, which is at risk each time we      expose him to contrast.      Lyn Records, M.D.  Electronically Signed     HWS/MEDQ  D:  10/27/2005  T:  10/27/2005  Job:  [id number]
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
## 2010-07-25 NOTE — Cardiovascular Report (Signed)
[place]  Patient:    William French, William French                    MRN: [id number] Proc. Date: 05/26/00 Adm. Date:  [id number] Attending:  Armanda Magic CC:         Barbette Hair. Vaughan Basta., M.D.  Armanda Magic, M.D.  Meade Maw, M.D.   Cardiac Catheterization  INDICATIONS FOR PROCEDURE:  High-grade stenosis in the proximal circumflex, likely the culprit lesion for the patients acute coronary syndrome.  PROCEDURES PERFORMED: 1. Left heart catheterization via percutaneous right brachial approach. 2. Direct stent proximal circumflex.  DESCRIPTION OF PROCEDURE:  After informed consent, percutaneous entry into the right brachial artery after local Xylocaine anesthesia was carried out using the modified Seldinger technique.  We then used a guidewire to reach the ascending aorta and placed a 6 [place] Amplatz I guide catheter.  We were able to obtain selective entry into the circumflex.  We obtained guiding shots and then we performed angioplasty and stenting on the proximal circumflex using direct stent approach.  We used a BMW 0.014 wire and a 3.0 mm diameter, 8 mm long, Bx Velocity balloon and stent system.  Two balloon inflations each to 12 atmospheres were performed to a expanded balloon/stent diameter of 3.12 mm. The patient tolerated each balloon inflation without significant complications.  The mid and distal circumflex and obtuse marginals contained diffuse disease but no high-grade obstruction.  The patient received 3000 units of IV heparin and was given a double bolus and infusion of Integrilin.  CONCLUSIONS:  Successful stent of the proximal circumflex from 85% to less than 10% with brisk antegrade flow.  PLAN:  Aspirin, Plavix.  Integrilin x 12 hours.  Further management per Dr. Armanda Magic. DD:  05/26/00 TD:  05/27/00 Job: [id number]
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
## 2011-02-01 ENCOUNTER — Inpatient Hospital Stay (HOSPITAL_COMMUNITY)
Admission: EM | Admit: 2011-02-01 | Discharge: 2011-02-09 | DRG: 280 | Disposition: A | Discharge status: Hospice | Payer: Medicare Other | Attending: Internal Medicine | Admitting: Internal Medicine

## 2011-02-01 ENCOUNTER — Emergency Department (HOSPITAL_COMMUNITY): Payer: Medicare Other

## 2011-02-01 ENCOUNTER — Encounter: Payer: Self-pay | Admitting: Emergency Medicine

## 2011-02-01 DIAGNOSIS — G8220 Paraplegia, unspecified: Secondary | ICD-10-CM | POA: Diagnosis present

## 2011-02-01 DIAGNOSIS — I120 Hypertensive chronic kidney disease with stage 5 chronic kidney disease or end stage renal disease: Secondary | ICD-10-CM | POA: Diagnosis present

## 2011-02-01 DIAGNOSIS — I5033 Acute on chronic diastolic (congestive) heart failure: Secondary | ICD-10-CM | POA: Diagnosis present

## 2011-02-01 DIAGNOSIS — I480 Paroxysmal atrial fibrillation: Secondary | ICD-10-CM | POA: Diagnosis present

## 2011-02-01 DIAGNOSIS — S129XXA Fracture of neck, unspecified, initial encounter: Secondary | ICD-10-CM | POA: Diagnosis present

## 2011-02-01 DIAGNOSIS — L89159 Pressure ulcer of sacral region, unspecified stage: Secondary | ICD-10-CM | POA: Diagnosis present

## 2011-02-01 DIAGNOSIS — I2510 Atherosclerotic heart disease of native coronary artery without angina pectoris: Secondary | ICD-10-CM | POA: Diagnosis present

## 2011-02-01 DIAGNOSIS — I440 Atrioventricular block, first degree: Secondary | ICD-10-CM | POA: Diagnosis present

## 2011-02-01 DIAGNOSIS — Z936 Other artificial openings of urinary tract status: Secondary | ICD-10-CM

## 2011-02-01 DIAGNOSIS — N185 Chronic kidney disease, stage 5: Secondary | ICD-10-CM | POA: Diagnosis present

## 2011-02-01 DIAGNOSIS — L89109 Pressure ulcer of unspecified part of back, unspecified stage: Secondary | ICD-10-CM | POA: Diagnosis present

## 2011-02-01 DIAGNOSIS — I214 Non-ST elevation (NSTEMI) myocardial infarction: Secondary | ICD-10-CM | POA: Diagnosis not present

## 2011-02-01 DIAGNOSIS — E1129 Type 2 diabetes mellitus with other diabetic kidney complication: Secondary | ICD-10-CM | POA: Diagnosis present

## 2011-02-01 DIAGNOSIS — D631 Anemia in chronic kidney disease: Secondary | ICD-10-CM | POA: Diagnosis present

## 2011-02-01 DIAGNOSIS — Z66 Do not resuscitate: Secondary | ICD-10-CM | POA: Diagnosis present

## 2011-02-01 DIAGNOSIS — R1310 Dysphagia, unspecified: Secondary | ICD-10-CM | POA: Diagnosis present

## 2011-02-01 DIAGNOSIS — Z86711 Personal history of pulmonary embolism: Secondary | ICD-10-CM

## 2011-02-01 DIAGNOSIS — S78119A Complete traumatic amputation at level between unspecified hip and knee, initial encounter: Secondary | ICD-10-CM

## 2011-02-01 DIAGNOSIS — Z882 Allergy status to sulfonamides status: Secondary | ICD-10-CM

## 2011-02-01 DIAGNOSIS — B370 Candidal stomatitis: Secondary | ICD-10-CM | POA: Diagnosis present

## 2011-02-01 DIAGNOSIS — N039 Chronic nephritic syndrome with unspecified morphologic changes: Secondary | ICD-10-CM | POA: Diagnosis present

## 2011-02-01 DIAGNOSIS — Z79899 Other long term (current) drug therapy: Secondary | ICD-10-CM

## 2011-02-01 DIAGNOSIS — Z436 Encounter for attention to other artificial openings of urinary tract: Secondary | ICD-10-CM

## 2011-02-01 DIAGNOSIS — Z794 Long term (current) use of insulin: Secondary | ICD-10-CM

## 2011-02-01 DIAGNOSIS — Z87891 Personal history of nicotine dependence: Secondary | ICD-10-CM

## 2011-02-01 DIAGNOSIS — IMO0002 Reserved for concepts with insufficient information to code with codable children: Secondary | ICD-10-CM | POA: Diagnosis present

## 2011-02-01 DIAGNOSIS — N189 Chronic kidney disease, unspecified: Secondary | ICD-10-CM | POA: Diagnosis present

## 2011-02-01 DIAGNOSIS — I509 Heart failure, unspecified: Secondary | ICD-10-CM | POA: Diagnosis present

## 2011-02-01 DIAGNOSIS — E039 Hypothyroidism, unspecified: Secondary | ICD-10-CM | POA: Diagnosis present

## 2011-02-01 DIAGNOSIS — Z9861 Coronary angioplasty status: Secondary | ICD-10-CM

## 2011-02-01 DIAGNOSIS — R627 Adult failure to thrive: Secondary | ICD-10-CM | POA: Diagnosis present

## 2011-02-01 DIAGNOSIS — Z515 Encounter for palliative care: Secondary | ICD-10-CM

## 2011-02-01 DIAGNOSIS — I4891 Unspecified atrial fibrillation: Secondary | ICD-10-CM | POA: Diagnosis present

## 2011-02-01 DIAGNOSIS — L8993 Pressure ulcer of unspecified site, stage 3: Secondary | ICD-10-CM | POA: Diagnosis present

## 2011-02-01 DIAGNOSIS — E1165 Type 2 diabetes mellitus with hyperglycemia: Secondary | ICD-10-CM | POA: Diagnosis present

## 2011-02-01 DIAGNOSIS — Z7901 Long term (current) use of anticoagulants: Secondary | ICD-10-CM

## 2011-02-01 HISTORY — DX: Essential (primary) hypertension: I10

## 2011-02-01 HISTORY — DX: Heart failure, unspecified: I50.9

## 2011-02-01 HISTORY — DX: Cardiac arrhythmia, unspecified: I49.9

## 2011-02-01 LAB — CBC
MCHC: 32.7 g/dL (ref 30.0–36.0)
MCV: 94.5 fL (ref 78.0–100.0)
Platelets: 258 10*3/uL (ref 150–400)
RDW: 14.4 % (ref 11.5–15.5)
WBC: 13.7 10*3/uL — ABNORMAL HIGH (ref 4.0–10.5)

## 2011-02-01 LAB — DIFFERENTIAL
Basophils Absolute: 0.2 10*3/uL — ABNORMAL HIGH (ref 0.0–0.1)
Basophils Relative: 1 % (ref 0–1)
Eosinophils Relative: 1 % (ref 0–5)
Lymphocytes Relative: 22 % (ref 12–46)

## 2011-02-01 LAB — POCT I-STAT, CHEM 8
Calcium, Ion: 1.01 mmol/L — ABNORMAL LOW (ref 1.12–1.32)
Hemoglobin: 11.2 g/dL — ABNORMAL LOW (ref 13.0–17.0)
Sodium: 141 mEq/L (ref 135–145)
TCO2: 17 mmol/L (ref 0–100)

## 2011-02-01 LAB — CARDIAC PANEL(CRET KIN+CKTOT+MB+TROPI)
Relative Index: INVALID (ref 0.0–2.5)
Total CK: 43 U/L (ref 7–232)

## 2011-02-01 MED ORDER — MORPHINE SULFATE 2 MG/ML IJ SOLN
2.0000 mg | Freq: Once | INTRAMUSCULAR | Status: AC
  Administered 2011-02-01: 2 mg via INTRAVENOUS
  Filled 2011-02-01: qty 1

## 2011-02-01 MED ORDER — ALBUTEROL SULFATE (5 MG/ML) 0.5% IN NEBU
INHALATION_SOLUTION | RESPIRATORY_TRACT | Status: AC
  Administered 2011-02-01: 2.5 mg
  Filled 2011-02-01: qty 1

## 2011-02-01 NOTE — ED Notes (Signed)
NEBULIZER TREATMENT IN PROGRESS , PORTABLE CHEST X-RAY AT BEDSIDE .

## 2011-02-01 NOTE — ED Notes (Signed)
ASSUMED CARE ON PT. INTRODUCED SELF , WARM BLANKET PROVIDED , BEDRAILS UP , IV UNREMARKABLE AT LEFT FOREARM BY EMS PTA. SEEN AND EXAMINED BY DR, Norlene Campbell.

## 2011-02-02 ENCOUNTER — Encounter (HOSPITAL_COMMUNITY): Payer: Self-pay | Admitting: Internal Medicine

## 2011-02-02 ENCOUNTER — Other Ambulatory Visit: Payer: Self-pay

## 2011-02-02 DIAGNOSIS — I480 Paroxysmal atrial fibrillation: Secondary | ICD-10-CM | POA: Diagnosis present

## 2011-02-02 DIAGNOSIS — E1129 Type 2 diabetes mellitus with other diabetic kidney complication: Secondary | ICD-10-CM | POA: Diagnosis present

## 2011-02-02 DIAGNOSIS — I509 Heart failure, unspecified: Secondary | ICD-10-CM | POA: Diagnosis present

## 2011-02-02 DIAGNOSIS — Z86711 Personal history of pulmonary embolism: Secondary | ICD-10-CM | POA: Diagnosis present

## 2011-02-02 DIAGNOSIS — I214 Non-ST elevation (NSTEMI) myocardial infarction: Secondary | ICD-10-CM | POA: Diagnosis not present

## 2011-02-02 DIAGNOSIS — N189 Chronic kidney disease, unspecified: Secondary | ICD-10-CM | POA: Diagnosis present

## 2011-02-02 LAB — CARDIAC PANEL(CRET KIN+CKTOT+MB+TROPI)
CK, MB: 14.1 ng/mL (ref 0.3–4.0)
CK, MB: 15.1 ng/mL (ref 0.3–4.0)
CK, MB: 11 ng/mL (ref 0.3–4.0)
Total CK: 114 U/L (ref 7–232)
Total CK: 110 U/L (ref 7–232)
Troponin I: 3 ng/mL (ref <0.30)
Troponin I: 3.51 ng/mL (ref <0.30)

## 2011-02-02 LAB — TSH: TSH: 1.91 u[IU]/mL (ref 0.350–4.500)

## 2011-02-02 LAB — COMPREHENSIVE METABOLIC PANEL
BUN: 90 mg/dL — ABNORMAL HIGH (ref 6–23)
CO2: 18 mEq/L — ABNORMAL LOW (ref 19–32)
Calcium: 7.3 mg/dL — ABNORMAL LOW (ref 8.4–10.5)
Chloride: 107 mEq/L (ref 96–112)
Creatinine, Ser: 2.87 mg/dL — ABNORMAL HIGH (ref 0.50–1.35)
GFR calc Af Amer: 22 mL/min — ABNORMAL LOW (ref >90)
GFR calc non Af Amer: 19 mL/min — ABNORMAL LOW (ref >90)
Glucose, Bld: 297 mg/dL — ABNORMAL HIGH (ref 70–99)
Total Bilirubin: 0.2 mg/dL — ABNORMAL LOW (ref 0.3–1.2)

## 2011-02-02 LAB — CBC
Hemoglobin: 8.9 g/dL — ABNORMAL LOW (ref 13.0–17.0)
MCH: 31.2 pg (ref 26.0–34.0)
Platelets: 233 10*3/uL (ref 150–400)
RBC: 2.85 MIL/uL — ABNORMAL LOW (ref 4.22–5.81)
WBC: 10.4 10*3/uL (ref 4.0–10.5)

## 2011-02-02 LAB — MAGNESIUM: Magnesium: 2 mg/dL (ref 1.5–2.5)

## 2011-02-02 LAB — PROTIME-INR: Prothrombin Time: 24 seconds — ABNORMAL HIGH (ref 11.6–15.2)

## 2011-02-02 LAB — GLUCOSE, CAPILLARY

## 2011-02-02 MED ORDER — ONDANSETRON HCL 4 MG PO TABS: 4.0000 mg | ORAL_TABLET | Freq: Four times a day (QID) | ORAL | Status: DC | PRN

## 2011-02-02 MED ORDER — INSULIN GLARGINE 100 UNIT/ML ~~LOC~~ SOLN
8.0000 [IU] | Freq: Every day | SUBCUTANEOUS | Status: DC
  Filled 2011-02-02 (×2): qty 3

## 2011-02-02 MED ORDER — NEBIVOLOL HCL 5 MG PO TABS
5.0000 mg | ORAL_TABLET | Freq: Every day | ORAL | Status: DC
  Administered 2011-02-02 – 2011-02-09 (×8): 5 mg via ORAL
  Filled 2011-02-02 (×8): qty 1

## 2011-02-02 MED ORDER — OXYCODONE HCL 10 MG PO TB12
10.0000 mg | ORAL_TABLET | Freq: Two times a day (BID) | ORAL | Status: DC
  Administered 2011-02-02 – 2011-02-09 (×14): 10 mg via ORAL
  Filled 2011-02-02 (×14): qty 1

## 2011-02-02 MED ORDER — ACETAMINOPHEN 650 MG RE SUPP: 650.0000 mg | Freq: Four times a day (QID) | RECTAL | Status: DC | PRN

## 2011-02-02 MED ORDER — PANCRELIPASE (LIP-PROT-AMYL) 12000-38000 UNITS PO CPEP
1.0000 | ORAL_CAPSULE | Freq: Three times a day (TID) | ORAL | Status: DC
  Administered 2011-02-02 – 2011-02-09 (×22): 1 via ORAL
  Filled 2011-02-02 (×27): qty 1

## 2011-02-02 MED ORDER — PANTOPRAZOLE SODIUM 40 MG PO TBEC
40.0000 mg | DELAYED_RELEASE_TABLET | Freq: Every day | ORAL | Status: DC
  Administered 2011-02-02 – 2011-02-04 (×3): 40 mg via ORAL
  Filled 2011-02-02 (×3): qty 1

## 2011-02-02 MED ORDER — ACETAMINOPHEN 325 MG PO TABS: 650.0000 mg | ORAL_TABLET | Freq: Four times a day (QID) | ORAL | Status: DC | PRN

## 2011-02-02 MED ORDER — ONDANSETRON HCL 4 MG/2ML IJ SOLN: 4.0000 mg | Freq: Four times a day (QID) | INTRAMUSCULAR | Status: DC | PRN

## 2011-02-02 MED ORDER — FUROSEMIDE 10 MG/ML IJ SOLN
40.0000 mg | Freq: Two times a day (BID) | INTRAMUSCULAR | Status: DC
  Administered 2011-02-02 – 2011-02-05 (×8): 40 mg via INTRAVENOUS
  Filled 2011-02-02 (×11): qty 4

## 2011-02-02 MED ORDER — INSULIN ASPART 100 UNIT/ML ~~LOC~~ SOLN
0.0000 [IU] | Freq: Every day | SUBCUTANEOUS | Status: DC
  Administered 2011-02-03: 5 [IU] via SUBCUTANEOUS
  Administered 2011-02-04: 3 [IU] via SUBCUTANEOUS
  Administered 2011-02-06: 2 [IU] via SUBCUTANEOUS
  Filled 2011-02-02: qty 3

## 2011-02-02 MED ORDER — NITROGLYCERIN 2 % TD OINT
1.0000 [in_us] | TOPICAL_OINTMENT | Freq: Four times a day (QID) | TRANSDERMAL | Status: DC
  Administered 2011-02-02 – 2011-02-04 (×7): 1 [in_us] via TOPICAL
  Filled 2011-02-02 (×2): qty 30

## 2011-02-02 MED ORDER — SODIUM CHLORIDE 0.9 % IJ SOLN
3.0000 mL | Freq: Two times a day (BID) | INTRAMUSCULAR | Status: DC
  Administered 2011-02-02 – 2011-02-09 (×15): 3 mL via INTRAVENOUS

## 2011-02-02 MED ORDER — FUROSEMIDE 10 MG/ML IJ SOLN
40.0000 mg | Freq: Once | INTRAMUSCULAR | Status: AC
  Administered 2011-02-02: 40 mg via INTRAVENOUS
  Filled 2011-02-02: qty 4

## 2011-02-02 MED ORDER — INSULIN ASPART 100 UNIT/ML ~~LOC~~ SOLN
0.0000 [IU] | Freq: Three times a day (TID) | SUBCUTANEOUS | Status: DC
  Administered 2011-02-03 (×2): 5 [IU] via SUBCUTANEOUS
  Administered 2011-02-04: 3 [IU] via SUBCUTANEOUS
  Administered 2011-02-04: 8 [IU] via SUBCUTANEOUS
  Administered 2011-02-04: 11 [IU] via SUBCUTANEOUS
  Administered 2011-02-05: 2 [IU] via SUBCUTANEOUS
  Administered 2011-02-05: 5 [IU] via SUBCUTANEOUS
  Administered 2011-02-05: 8 [IU] via SUBCUTANEOUS
  Administered 2011-02-06 (×3): 3 [IU] via SUBCUTANEOUS
  Administered 2011-02-07: 2 [IU] via SUBCUTANEOUS
  Administered 2011-02-07: 15 [IU] via SUBCUTANEOUS
  Administered 2011-02-08 (×2): 2 [IU] via SUBCUTANEOUS
  Administered 2011-02-08: 15 [IU] via SUBCUTANEOUS
  Administered 2011-02-09: 3 [IU] via SUBCUTANEOUS
  Administered 2011-02-09: 5 [IU] via SUBCUTANEOUS
  Filled 2011-02-02: qty 3

## 2011-02-02 MED ORDER — LORATADINE 10 MG PO TABS
10.0000 mg | ORAL_TABLET | Freq: Every day | ORAL | Status: DC
  Administered 2011-02-02 – 2011-02-09 (×8): 10 mg via ORAL
  Filled 2011-02-02 (×9): qty 1

## 2011-02-02 MED ORDER — HYDRALAZINE HCL 25 MG PO TABS
25.0000 mg | ORAL_TABLET | Freq: Three times a day (TID) | ORAL | Status: DC
  Administered 2011-02-02: 25 mg via ORAL
  Filled 2011-02-02 (×4): qty 1

## 2011-02-02 MED ORDER — WARFARIN SODIUM 2 MG PO TABS
2.0000 mg | ORAL_TABLET | ORAL | Status: DC
  Administered 2011-02-02 – 2011-02-04 (×3): 2 mg via ORAL
  Filled 2011-02-02 (×4): qty 1

## 2011-02-02 MED ORDER — WARFARIN SODIUM 1 MG PO TABS
1.0000 mg | ORAL_TABLET | ORAL | Status: DC
Start: 2011-02-05 — End: 2011-02-09
  Administered 2011-02-05 – 2011-02-08 (×3): 1 mg via ORAL
  Filled 2011-02-02 (×4): qty 1

## 2011-02-02 MED ORDER — ISOSORB DINITRATE-HYDRALAZINE 20-37.5 MG PO TABS
1.0000 | ORAL_TABLET | Freq: Three times a day (TID) | ORAL | Status: DC
  Administered 2011-02-03 – 2011-02-09 (×20): 1 via ORAL
  Filled 2011-02-02 (×23): qty 1

## 2011-02-02 MED ORDER — HYDROCODONE-ACETAMINOPHEN 5-325 MG PO TABS
1.0000 | ORAL_TABLET | Freq: Four times a day (QID) | ORAL | Status: DC | PRN
  Administered 2011-02-02 – 2011-02-07 (×4): 1 via ORAL
  Filled 2011-02-02 (×4): qty 1

## 2011-02-02 NOTE — Progress Notes (Addendum)
ANTICOAGULATION CONSULT NOTE - Initial Consult  Pharmacy Consult for Coumadin Indication: h/o PE/PAF  Allergies  Allergen Reactions  . Diltiazem Anaphylaxis  . Sulfa Antibiotics Rash   Patient Measurements: Height: 5\' 8"  (172.7 cm) Weight: 120 lb 13 oz (54.8 kg) IBW/kg (Calculated) : 68.4   Vital Signs: Temp: 97.6 F (36.4 C) (11/26 0345) Temp src: Oral (11/26 0345) BP: 107/40 mmHg (11/26 0345) Pulse Rate: 91  (11/26 0345)  Labs:  Basename 02/01/11 2311 02/01/11 2254 02/01/11 2253  HGB 11.2* -- 10.1*  HCT 33.0* -- 30.9*  PLT -- -- 258  APTT -- -- --  LABPROT -- -- --  INR -- -- --  HEPARINUNFRC -- -- --  CREATININE 3.10* -- --  CKTOTAL -- 43 --  CKMB -- 4.9* --  TROPONINI -- <0.30 --   Estimated Creatinine Clearance: 13.5 ml/min (by C-G formula based on Cr of 3.1).  Medical History: Past Medical History  Diagnosis Date  . Diabetes mellitus   . CHF (congestive heart failure)   . Dysrhythmia   . Hypertension    Medications:  Prescriptions prior to admission  Medication Sig Dispense Refill  . Amylase-Lipase-Protease (PANCREASE MT 10 PO) Take 10 mg by mouth daily.        . cetirizine (ZYRTEC) 10 MG tablet Take 10 mg by mouth daily.        . furosemide (LASIX) 40 MG tablet Take 40 mg by mouth 2 (two) times daily.        . hydrALAZINE (APRESOLINE) 25 MG tablet Take 25 mg by mouth 3 (three) times daily.        Marland Kitchen HYDROcodone-acetaminophen (NORCO) 5-325 MG per tablet Take 1 tablet by mouth every 6 (six) hours as needed. For pain       . insulin glargine (LANTUS SOLOSTAR) 100 UNIT/ML injection Inject 8 Units into the skin at bedtime.        . insulin regular (HUMULIN R,NOVOLIN R) 100 units/mL injection Inject 1-2 Units into the skin 3 (three) times daily before meals. Per sliding scale       . Levothyroxine Sodium (SYNTHROID PO) Take 1 tablet by mouth daily.        . Nebivolol HCl (BYSTOLIC PO) Take 1 tablet by mouth daily.        Marland Kitchen oxycodone (OXY-IR) 5 MG capsule  Take 5 mg by mouth every 4 (four) hours as needed. For pain       . oxyCODONE (OXYCONTIN) 10 MG 12 hr tablet Take 10 mg by mouth every 12 (twelve) hours.        . pantoprazole (PROTONIX) 40 MG tablet Take 40 mg by mouth daily.        Marland Kitchen warfarin (COUMADIN) 2 MG tablet Take 1-2 mg by mouth daily. As directed by home meter       Scheduled:    . albuterol      . albuterol      . furosemide  40 mg Intravenous Once  . furosemide  40 mg Intravenous BID  . hydrALAZINE  25 mg Oral TID  . insulin glargine  8 Units Subcutaneous QHS  . lipase/protease/amylase  1 capsule Oral TID WC  . loratadine  10 mg Oral Daily  .  morphine injection  2 mg Intravenous Once  . nebivolol  5 mg Oral Daily  . oxyCODONE  10 mg Oral BID  . pantoprazole  40 mg Oral Q lunch  . sodium chloride  3 mL Intravenous Q12H  Assessment: 77yo male on Coumadin PTA for h/o PE/PAF to continue anticoagulation during admission for CHF.  Goal of Therapy:  INR 2-3   Plan:  Will confirm home dose of Coumadin and INR prior to dosing.  Colleen Can PharmD BCPS 02/02/2011,4:24 AM   Addum:  INR 2.11.  Pt takes 2 mg Mon,Tues and Wed and 1 mg other days.  Will cont home regimen.

## 2011-02-02 NOTE — Progress Notes (Signed)
Inpatient Diabetes Program Recommendations  AACE/ADA: New Consensus Statement on Inpatient Glycemic Control (2009)  Target Ranges:  Prepandial:   less than 140 mg/dL      Peak postprandial:   less than 180 mg/dL (1-2 hours)      Critically ill patients:  140 - 180 mg/dL   Reason for Visit: CBGs 220, 230  Inpatient Diabetes Program Recommendations Correction (SSI): start Novolog sensitive TID

## 2011-02-02 NOTE — Progress Notes (Signed)
Subjective: Patient feeling very tired. Overall he states he's feeling better. Breathing better. No chest pain.  Objective: Weight change:   Intake/Output Summary (Last 24 hours) at 02/02/11 1811 Last data filed at 02/02/11 1444  Gross per 24 hour  Intake    100 ml  Output    700 ml  Net   -600 ml   BP 151/53  Pulse 71  Temp(Src) 98 F (36.7 C) (Oral)  Resp 18  Ht 5\' 8"  (1.727 m)  Wt 54.8 kg (120 lb 13 oz)  BMI 18.37 kg/m2  SpO2 93% General: Alert and oriented x3, no apparent distress, appears stated age Cardiovascular: Irregular rhythm, rate controlled, Lungs: Bilateral end expiratory wheezing Abdomen: Soft, nontender, nondistended, hypoactive bowel sounds Extremities: Trace pitting edema, emaciated S. patient is paralyzed.  Lab Results: Basic Metabolic Panel:  Basename 02/02/11 1042 02/01/11 2311  NA 138 141  K 3.4* 3.5  CL 107 112  CO2 18* --  GLUCOSE 297* 136*  BUN 90* 85*  CREATININE 2.87* 3.10*  CALCIUM 7.3* --  MG 2.0 --  PHOS -- --   Liver Function Tests:  [facility] 02/02/11 1042  AST 19  ALT 7  ALKPHOS 72  BILITOT 0.2*  PROT 6.5  ALBUMIN 2.3*   CBC:  Basename 02/02/11 1042 02/01/11 2311 02/01/11 2253  WBC 10.4 -- 13.7*  NEUTROABS -- -- 9.1*  HGB 8.9* 11.2* --  HCT 27.0* 33.0* --  MCV 94.7 -- 94.5  PLT 233 -- 258   Cardiac Enzymes:  Basename 02/02/11 1042 02/02/11 0857 02/01/11 2254  CKTOTAL 127 114 43  CKMB 15.1* 14.1* 4.9*  CKMBINDEX -- -- --  TROPONINI 3.84* 3.00* <0.30   BNP:  Basename 02/01/11 2254  POCBNP 14522.0*   CBG:  Basename 02/02/11 1614 02/02/11 1251 02/02/11 0607  GLUCAP 203* 230* 220*   Coagulation:  Basename 02/02/11 1042  LABPROT 24.0*  INR 2.11*    Studies/Results: Dg Chest Port 1 View  02/02/2011   IMPRESSION: Moderate pulmonary edema and probable right pleural effusion.  Original Report Authenticated By: Reola Calkins, M.D.   Medications: Scheduled Meds:   . albuterol      . albuterol        . furosemide  40 mg Intravenous Once  . furosemide  40 mg Intravenous BID  . hydrALAZINE  25 mg Oral TID  . insulin aspart  0-15 Units Subcutaneous TID WC  . insulin aspart  0-5 Units Subcutaneous QHS  . insulin glargine  8 Units Subcutaneous QHS  . lipase/protease/amylase  1 capsule Oral TID WC  . loratadine  10 mg Oral Daily  .  morphine injection  2 mg Intravenous Once  . nebivolol  5 mg Oral Daily  . nitroGLYCERIN  1 inch Topical Q6H  . oxyCODONE  10 mg Oral BID  . pantoprazole  40 mg Oral Q lunch  . sodium chloride  3 mL Intravenous Q12H  . warfarin  1 mg Oral Custom  . warfarin  2 mg Oral Custom   Continuous Infusions:  PRN Meds:.acetaminophen, acetaminophen, HYDROcodone-acetaminophen, ondansetron (ZOFRAN) IV, ondansetron  Assessment/Plan: Patient Active Hospital Problem List: NSTEMI (non-ST elevated myocardial infarction) (02/02/2011)  seen by [facility] cardiology. Given advanced age and multiple comorbidities, not a vascularization candidate. We'll optimize medical management. Currently already anticoagulated plus on nitro paste. We'll look into long standing nitrates for chest pain.  CHF (congestive heart failure) (02/02/2011)  continue diuresis.  CKD (chronic kidney disease) (02/02/2011)  stable.  PAF (paroxysmal atrial fibrillation) (  02/02/2011)  rate controlled and INR therapeutic  DM (diabetes mellitus) type II uncontrolled with renal manifestation (02/02/2011)  sugar is likely rising because of MI.   LOS: 1 day   [place] Basista K 02/02/2011, 6:11 PM

## 2011-02-02 NOTE — Progress Notes (Signed)
Utilization review completed.  

## 2011-02-02 NOTE — H&P (Signed)
William French is an 77 y.o. male.   Chief Complaint: Shortness of breath. HPI: 77 year old history of diastolic heart failure, paroxysmal atrial fibrillation, history of pulmonary embolism on Coumadin, hypothyroidism, chronic kidney disease stage 3-4 presented with worsening shortness of breath over the last 2 days. Patient denies any chest pain, cough or phlegm. Denies any fever chills, nausea vomiting or any palpitations. In the ER patient had chest x-ray which shows right-sided pleural effusion and pulmonary edema. Patient at this time was given Lasix 40 mg IV by the ER physician and has been admitted for further workup.  Past Medical History  Diagnosis Date  . Diabetes mellitus   . CHF (congestive heart failure)   . Dysrhythmia   . Hypertension     Past Surgical History  Procedure Date  . Leg amputation above knee   . Coronary stent placement     History reviewed. No pertinent family history. Social History:  reports that he has quit smoking. He does not have any smokeless tobacco history on file. He reports that he does not drink alcohol or use illicit drugs.  Allergies:  Allergies  Allergen Reactions  . Diltiazem Anaphylaxis  . Sulfa Antibiotics Rash    Medications Prior to Admission  Medication Dose Route Frequency Provider Last Rate Last Dose  . albuterol (PROVENTIL) (5 MG/ML) 0.5% nebulizer solution        2.5 mg at 02/01/11 2332  . albuterol (PROVENTIL) (5 MG/ML) 0.5% nebulizer solution        2.5 mg at 02/01/11 2331  . furosemide (LASIX) injection 40 mg  40 mg Intravenous Once Olivia Mackie, MD   40 mg at 02/02/11 0028  . morphine 2 MG/ML injection 2 mg  2 mg Intravenous Once Olivia Mackie, MD   2 mg at 02/01/11 2327   No current outpatient prescriptions on file as of 02/01/2011.    Results for orders placed during the hospital encounter of 02/01/11 (from the past 48 hour(s))  CBC     Status: Abnormal   Collection Time   02/01/11 10:53 PM      Component Value  Range Comment   WBC 13.7 (*) 4.0 - 10.5 (K/uL)    RBC 3.27 (*) 4.22 - 5.81 (MIL/uL)    Hemoglobin 10.1 (*) 13.0 - 17.0 (g/dL)    HCT 16.1 (*) 09.6 - 52.0 (%)    MCV 94.5  78.0 - 100.0 (fL)    MCH 30.9  26.0 - 34.0 (pg)    MCHC 32.7  30.0 - 36.0 (g/dL)    RDW 04.5  40.9 - 81.1 (%)    Platelets 258  150 - 400 (K/uL)   DIFFERENTIAL     Status: Abnormal   Collection Time   02/01/11 10:53 PM      Component Value Range Comment   Neutrophils Relative 66  43 - 77 (%)    Neutro Abs 9.1 (*) 1.7 - 7.7 (K/uL)    Lymphocytes Relative 22  12 - 46 (%)    Lymphs Abs 3.0  0.7 - 4.0 (K/uL)    Monocytes Relative 10  3 - 12 (%)    Monocytes Absolute 1.3 (*) 0.1 - 1.0 (K/uL)    Eosinophils Relative 1  0 - 5 (%)    Eosinophils Absolute 0.1  0.0 - 0.7 (K/uL)    Basophils Relative 1  0 - 1 (%)    Basophils Absolute 0.2 (*) 0.0 - 0.1 (K/uL)   CARDIAC PANEL(CRET KIN+CKTOT+MB+TROPI)  Status: Abnormal   Collection Time   02/01/11 10:54 PM      Component Value Range Comment   Total CK 43  7 - 232 (U/L)    CK, MB 4.9 (*) 0.3 - 4.0 (ng/mL)    Troponin I <0.30  <0.30 (ng/mL)    Relative Index RELATIVE INDEX IS INVALID  0.0 - 2.5    PRO B NATRIURETIC PEPTIDE     Status: Abnormal   Collection Time   02/01/11 10:54 PM      Component Value Range Comment   BNP, POC 14522.0 (*) 0 - 450 (pg/mL)   POCT I-STAT, CHEM 8     Status: Abnormal   Collection Time   02/01/11 11:11 PM      Component Value Range Comment   Sodium 141  135 - 145 (mEq/L)    Potassium 3.5  3.5 - 5.1 (mEq/L)    Chloride 112  96 - 112 (mEq/L)    BUN 85 (*) 6 - 23 (mg/dL)    Creatinine, Ser 1.61 (*) 0.50 - 1.35 (mg/dL)    Glucose, Bld 096 (*) 70 - 99 (mg/dL)    Calcium, Ion 0.45 (*) 1.12 - 1.32 (mmol/L)    TCO2 17  0 - 100 (mmol/L)    Hemoglobin 11.2 (*) 13.0 - 17.0 (g/dL)    HCT 40.9 (*) 81.1 - 52.0 (%)    Dg Chest Port 1 View  02/02/2011  *RADIOLOGY REPORT*  Clinical Data: CHF and shortness of breath.  PORTABLE CHEST - 1 VIEW   Comparison: 03/28/2010  Findings: Moderate pulmonary edema present with probable component of right pleural fluid.  Stable cardiomegaly.  IMPRESSION: Moderate pulmonary edema and probable right pleural effusion.  Original Report Authenticated By: Reola Calkins, M.D.    Review of Systems  Constitutional: Negative.   HENT: Negative.   Eyes: Negative.   Respiratory: Positive for shortness of breath.   Cardiovascular: Negative.   Gastrointestinal: Negative.   Genitourinary: Negative.   Musculoskeletal: Negative.   Skin: Negative.   Neurological: Negative.   Endo/Heme/Allergies: Negative.   Psychiatric/Behavioral: Negative.     Blood pressure 115/42, pulse 98, temperature 97.7 F (36.5 C), temperature source Oral, resp. rate 20, SpO2 94.00%. Physical Exam  Constitutional: He is oriented to person, place, and time. He appears well-developed and well-nourished.  HENT:  Head: Normocephalic and atraumatic.  Right Ear: External ear normal.  Left Ear: External ear normal.  Nose: Nose normal.  Mouth/Throat: Oropharynx is clear and moist.  Eyes: Conjunctivae and EOM are normal. Pupils are equal, round, and reactive to light.  Neck: Normal range of motion. Neck supple.  Cardiovascular: Normal rate, normal heart sounds and intact distal pulses.   Respiratory: Effort normal and breath sounds normal.  GI: Soft. Bowel sounds are normal.  Musculoskeletal: He exhibits no edema and no tenderness.       Left leg above knee amputation. Right leg contracture.  Neurological: He is alert and oriented to person, place, and time.  Skin: Skin is warm and dry.  Psychiatric: His behavior is normal.     Assessment/Plan #1. Decompensated diastolic CHF, last EF measured in November of 2011 was 65-70%. #2. History of pulmonary embolism on Coumadin. #3. History of paroxysmal atrial fibrillation. #4. Diabetes mellitus type 2. #5. Peripheral arterial disease. #6. Urostomy bag secondary to bladder  removal. #7. Chronic kidney disease stage 3-4.   Plan At this time we will admit patient to telemetry. For his decompensated CHF we will continue with  IV Lasix 40 mg every 12. We will closely monitor his creatinine, daily weight and intake output. Patient at this time is not having any chest pain. Patient's Synthroid dose is not clearly known we need to verify that and restart. Patient is a DO NOT RESUSCITATE. Coumadin will be dosed per pharmacy.  Zakyla Tonche N. 02/02/2011, 2:06 AM

## 2011-02-02 NOTE — ED Notes (Signed)
ADMITTING HOSPITALIST MD AT BEDSIDE EVALUATING PT.

## 2011-02-02 NOTE — ED Provider Notes (Addendum)
History     CSN: [id number] Arrival date & time: 02/01/2011 10:43 PM   First MD Initiated Contact with Patient 02/01/11 2303      Chief Complaint  Patient presents with  . Shortness of Breath  . Chest Pain    (Consider location/radiation/quality/duration/timing/severity/associated sxs/prior treatment) HPI 77 year old male presents to the emergency department with complaint of worsening shortness of breath and feeling of fluid backing up his lungs for one day. Patient with history of diabetes congestive heart failure and cardiac disease status post stent. Patient reports he normally takes 40 of Lasix twice a day, but took an extra one today as he felt that he was more full of fluid. Patient reports his last admission was in January for similar presentation. He denies any chest pain. Patient is followed by Dr. Redmond School who is his primary care doctor and also by Dr. Carolanne Grumbling with cardiology Past Medical History  Diagnosis Date  . Diabetes mellitus   . CHF (congestive heart failure)   . Hypertension   . Dysrhythmia     Past Surgical History  Procedure Date  . Leg amputation above knee   . Coronary stent placement     History reviewed. No pertinent family history.  History  Substance Use Topics  . Smoking status: Former Games developer  . Smokeless tobacco: Not on file  . Alcohol Use: No      Review of Systems  All other systems reviewed and are negative.    Allergies  Diltiazem and Sulfa antibiotics  Home Medications  No current outpatient prescriptions on file.  BP 107/40  Pulse 91  Temp(Src) 97.6 F (36.4 C) (Oral)  Resp 20  Ht 5\' 8"  (1.727 m)  Wt 120 lb 13 oz (54.8 kg)  BMI 18.37 kg/m2  SpO2 94%  Physical Exam  Vitals reviewed. Constitutional: He is oriented to person, place, and time.       Frail appearing, uncomfortable  HENT:  Head: Normocephalic and atraumatic.  Nose: Nose normal.  Mouth/Throat: Oropharynx is clear and moist.  Eyes: Conjunctivae and  EOM are normal. Pupils are equal, round, and reactive to light.  Neck: Normal range of motion. Neck supple. No JVD present. No tracheal deviation present. No thyromegaly present.  Cardiovascular: Normal rate, regular rhythm, normal heart sounds and intact distal pulses.  Exam reveals no gallop and no friction rub.   No murmur heard. Pulmonary/Chest: Effort normal. No stridor. No respiratory distress. He has no wheezes. He has rales. He exhibits no tenderness.  Abdominal: Soft. Bowel sounds are normal. He exhibits no distension and no mass. There is no tenderness. There is no rebound and no guarding.  Musculoskeletal: Normal range of motion. He exhibits no edema and no tenderness.       AKA noted  Lymphadenopathy:    He has no cervical adenopathy.  Neurological: He is oriented to person, place, and time.  Skin: Skin is dry. No rash noted. No erythema. No pallor.  Psychiatric: He has a normal mood and affect. His behavior is normal. Judgment and thought content normal.    ED Course  Procedures (including critical care time)  Labs Reviewed  CBC - Abnormal; Notable for the following:    WBC 13.7 (*)    RBC 3.27 (*)    Hemoglobin 10.1 (*)    HCT 30.9 (*)    All other components within normal limits  DIFFERENTIAL - Abnormal; Notable for the following:    Neutro Abs 9.1 (*)    Monocytes Absolute  1.3 (*)    Basophils Absolute 0.2 (*)    All other components within normal limits  CARDIAC PANEL(CRET KIN+CKTOT+MB+TROPI) - Abnormal; Notable for the following:    CK, MB 4.9 (*)    All other components within normal limits  PRO B NATRIURETIC PEPTIDE - Abnormal; Notable for the following:    BNP, POC 14522.0 (*)    All other components within normal limits  POCT I-STAT, CHEM 8 - Abnormal; Notable for the following:    BUN 85 (*)    Creatinine, Ser 3.10 (*)    Glucose, Bld 136 (*)    Calcium, Ion 1.01 (*)    Hemoglobin 11.2 (*)    HCT 33.0 (*)    All other components within normal limits    GLUCOSE, CAPILLARY - Abnormal; Notable for the following:    Glucose-Capillary 220 (*)    All other components within normal limits  POCT CBG MONITORING  COMPREHENSIVE METABOLIC PANEL  CBC  TSH  MAGNESIUM  CARDIAC PANEL(CRET KIN+CKTOT+MB+TROPI)  CARDIAC PANEL(CRET KIN+CKTOT+MB+TROPI)  CARDIAC PANEL(CRET KIN+CKTOT+MB+TROPI)  PROTIME-INR   Dg Chest Port 1 View  02/02/2011  *RADIOLOGY REPORT*  Clinical Data: CHF and shortness of breath.  PORTABLE CHEST - 1 VIEW  Comparison: 03/28/2010  Findings: Moderate pulmonary edema present with probable component of right pleural fluid.  Stable cardiomegaly.  IMPRESSION: Moderate pulmonary edema and probable right pleural effusion.  Original Report Authenticated By: Reola Calkins, M.D.   Date: 02/02/2011  Rate: 87  Rhythm: normal sinus rhythm  QRS Axis: normal  Intervals: PR prolonged  ST/T Wave abnormalities: nonspecific T wave changes  Conduction Disutrbances:nonspecific intraventricular conduction delay  Narrative Interpretation:   Old EKG Reviewed: unchanged    1. CHF exacerbation       MDM  77 year old gentleman with worsening shortness of breath over the last day without chest pain. Workup shows elevated BNP and pulmonary edema on chest x-ray. EKG unchanged and cardiac markers negative. Discussed with hospitalist for admission        Olivia Mackie, MD 02/02/11 1610  Olivia Mackie, MD 02/02/11 [phone]

## 2011-02-02 NOTE — Consult Note (Addendum)
Admit date: 02/01/2011 Referring Physician  triad hospitalists, Dr. Rito Ehrlich Primary Physician  : Dr. Merlene Laughter; Dr. Redmond School Primary Cardiologist  : Dr. Kym Groom Reason for Consultation  Diastolic heart failure and elevated troponin  ASSESSMENT: 1. Acute diastolic heart failure superimposed on chronic diastolic heart failure.   2. Non-ST elevation myocardial infarction denoted by elevated troponin, type I versus type II (most likely).  3. Significant anemia possibly contributing to problem #2.  4. Diabetes mellitus  5. Hypertension  6. Paroxysmal atrial fibrillation  7. Prior pulmonary embolism  8. Chronic kidney disease, stage IV  PLAN:  1. The patient is not a candidate for invasive cardiac procedures due to significant medical comorbidities including age, and significant chronic kidney disease.  2. Optimize therapy for heart failure. Not on a beta blocker currently. Unable to use ACE/ARB due to renal failure.  3. Optimize therapy for ischemic heart disease to include beta blocker therapy, statin therapy, aspirin, and long acting nitrate. 4. Consider Bidil given the patient's renal insufficiency..  5. I would not recommend myocardial perfusion evaluation/ischemia workup as we have no treatment options for revascularization (not a candidate for CABG or PCI).  6. He is apparently back on Coumadin anticoagulation therapy. Our most recent office notes suggested this was discontinued because of inability to have proper followup due to his inability to come into the office   HPI: The patient is a very pleasant 77 year old gentleman who lives at home with his wife. He has a right above-the-knee amputation. He has been unable to come into the office for evaluation. He was last seen by Dr. Mayford Knife in our office in April of 2011. Coumadin therapy had been discontinued because there was no mechanism to adequately follow his INR. He is admitted to the hospital at this time after  developing increasing shortness of breath, rather suddenly. There was some associated mild chest discomfort that is poorly characterized.   PMH:   Past Medical History  Diagnosis Date  . Diabetes mellitus   . CHF (congestive heart failure)   . Hypertension   . Dysrhythmia      PSH:   Past Surgical History  Procedure Date  . Leg amputation above knee   . Coronary stent placement     Allergies:  Diltiazem and Sulfa antibiotics Prior to Admit Meds:   Prescriptions prior to admission  Medication Sig Dispense Refill  . Amylase-Lipase-Protease (PANCREASE MT 10 PO) Take 10 mg by mouth daily.        . cetirizine (ZYRTEC) 10 MG tablet Take 10 mg by mouth daily.        . furosemide (LASIX) 40 MG tablet Take 40 mg by mouth 2 (two) times daily.        . hydrALAZINE (APRESOLINE) 25 MG tablet Take 25 mg by mouth 3 (three) times daily.        Marland Kitchen HYDROcodone-acetaminophen (NORCO) 5-325 MG per tablet Take 1 tablet by mouth every 6 (six) hours as needed. For pain       . insulin glargine (LANTUS SOLOSTAR) 100 UNIT/ML injection Inject 8 Units into the skin at bedtime.        . insulin regular (HUMULIN R,NOVOLIN R) 100 units/mL injection Inject 1-2 Units into the skin 3 (three) times daily before meals. Per sliding scale       . levothyroxine (SYNTHROID, LEVOTHROID) 125 MCG tablet Take 125 mcg by mouth daily.        . nebivolol (BYSTOLIC) 5 MG tablet Take 5 mg  by mouth daily.        . nitroGLYCERIN (NITROSTAT) 0.4 MG SL tablet Place 0.4 mg under the tongue every 5 (five) minutes as needed. For chest pain       . oxycodone (OXY-IR) 5 MG capsule Take 5 mg by mouth every 4 (four) hours as needed. For pain       . oxyCODONE (OXYCONTIN) 10 MG 12 hr tablet Take 10 mg by mouth every 12 (twelve) hours.        . pantoprazole (PROTONIX) 40 MG tablet Take 80 mg by mouth daily.        Marland Kitchen warfarin (COUMADIN) 2 MG tablet Take 1-2 mg by mouth daily. As directed by home meter       Fam HX:   History reviewed. No  pertinent family history. Social HX:    History   Social History  . Marital Status: Married    Spouse Name: N/A    Number of Children: N/A  . Years of Education: N/A   Occupational History  . Not on file.   Social History Main Topics  . Smoking status: Former Games developer  . Smokeless tobacco: Not on file  . Alcohol Use: No  . Drug Use: No  . Sexually Active: No   Other Topics Concern  . Not on file   Social History Narrative  . No narrative on file     Review of Systems: no significant chills, fever, or cough. He has lost weight. His appetite has been decreasing. Otherwise review of systems is negative.  Physical Exam: Blood pressure 137/53, pulse 71, temperature 98 F (36.7 C), temperature source Oral, resp. rate 18, height 5\' 8"  (1.727 m), weight 54.8 kg (120 lb 13 oz), SpO2 93.00%. Weight change:    The patient is frail-appearing. There is pallor. He is lying nearly flat and has oxygen in his naris. He is in no acute distress. There is moderate JVD to the angle of the jaw. No carotid bruits are heard. The lungs are clear anteriorly. Perhaps a soft S4 versus S3 gallop is audible. The left lower extremity contains no edema. There is a right AKA. The patient is alert and oriented. He is able to answer questions appropriately per Labs:   Lab Results  Component Value Date   WBC 10.4 02/02/2011   HGB 8.9* 02/02/2011   HCT 27.0* 02/02/2011   MCV 94.7 02/02/2011   PLT 233 02/02/2011    Lab 02/02/11 1042  NA 138  K 3.4*  CL 107  CO2 18*  BUN 90*  CREATININE 2.87*  CALCIUM 7.3*  PROT 6.5  BILITOT 0.2*  ALKPHOS 72  ALT 7  AST 19  GLUCOSE 297*   No results found for this basename: PTT   Lab Results  Component Value Date   INR 2.11* 02/02/2011   INR 1.74* 03/29/2010   INR 2.51* 03/28/2010   Lab Results  Component Value Date   CKTOTAL 127 02/02/2011   CKMB 15.1* 02/02/2011   TROPONINI 3.84* 02/02/2011       Radiology:  Dg Chest Port 1 View  02/02/2011   *RADIOLOGY REPORT*  Clinical Data: CHF and shortness of breath.  PORTABLE CHEST - 1 VIEW  Comparison: 03/28/2010  Findings: Moderate pulmonary edema present with probable component of right pleural fluid.  Stable cardiomegaly.  IMPRESSION: Moderate pulmonary edema and probable right pleural effusion.  Original Report Authenticated By: Reola Calkins, M.D.   EKG:  Sinus rhythm, first degree AV block, precordial T-wave  abnormality. No significant change from prior tracings.    Lesleigh Noe 02/02/2011 4:29 PM

## 2011-02-02 NOTE — Progress Notes (Signed)
Notified by lab that ckmb 14 and trop. 3. Notified Dr. Rito Ehrlich.

## 2011-02-02 NOTE — ED Notes (Signed)
RPORTS GIVEN TO [id number] FLOOR NURSE .

## 2011-02-03 LAB — PROTIME-INR
INR: 2.43 — ABNORMAL HIGH (ref 0.00–1.49)
Prothrombin Time: 26.8 seconds — ABNORMAL HIGH (ref 11.6–15.2)

## 2011-02-03 LAB — CBC
Platelets: 214 10*3/uL (ref 150–400)
RBC: 2.51 MIL/uL — ABNORMAL LOW (ref 4.22–5.81)
RDW: 14.7 % (ref 11.5–15.5)
WBC: 7.4 10*3/uL (ref 4.0–10.5)

## 2011-02-03 LAB — BASIC METABOLIC PANEL
Calcium: 7.4 mg/dL — ABNORMAL LOW (ref 8.4–10.5)
Chloride: 110 mEq/L (ref 96–112)
Creatinine, Ser: 2.89 mg/dL — ABNORMAL HIGH (ref 0.50–1.35)
GFR calc Af Amer: 21 mL/min — ABNORMAL LOW (ref >90)
Sodium: 141 mEq/L (ref 135–145)

## 2011-02-03 LAB — CARDIAC PANEL(CRET KIN+CKTOT+MB+TROPI)
Relative Index: INVALID (ref 0.0–2.5)
Total CK: 80 U/L (ref 7–232)
Total CK: 73 U/L (ref 7–232)

## 2011-02-03 LAB — GLUCOSE, CAPILLARY
Glucose-Capillary: 124 mg/dL — ABNORMAL HIGH (ref 70–99)
Glucose-Capillary: 156 mg/dL — ABNORMAL HIGH (ref 70–99)
Glucose-Capillary: 221 mg/dL — ABNORMAL HIGH (ref 70–99)

## 2011-02-03 MED ORDER — INSULIN GLARGINE 100 UNIT/ML ~~LOC~~ SOLN
10.0000 [IU] | Freq: Every day | SUBCUTANEOUS | Status: DC
  Administered 2011-02-03: 10 [IU] via SUBCUTANEOUS

## 2011-02-03 NOTE — Progress Notes (Signed)
ANTICOAGULATION CONSULT NOTE - Follow Up Consult  Pharmacy Consult for Coumadin Indication: hx PE, PAF  Allergies  Allergen Reactions  . Diltiazem Anaphylaxis  . Sulfa Antibiotics Rash    Patient Measurements: Height: 5\' 8"  (172.7 cm) Weight: 117 lb 4.6 oz (53.2 kg) IBW/kg (Calculated) : 68.4   Vital Signs: Temp: 97.1 F (36.2 C) (11/27 0603) Temp src: Oral (11/27 0603) BP: 132/50 mmHg (11/27 0603) Pulse Rate: 72  (11/27 0603)  Labs:  William French 02/03/11 0550 02/02/11 2022 02/02/11 1042 02/02/11 0857 02/01/11 2311 02/01/11 2253  HGB 7.6* -- 8.9* -- -- --  HCT 24.0* -- 27.0* -- 33.0* --  PLT 214 -- 233 -- -- 258  APTT -- -- -- -- -- --  LABPROT 26.8* -- 24.0* -- -- --  INR 2.43* -- 2.11* -- -- --  HEPARINUNFRC -- -- -- -- -- --  CREATININE 2.89* -- 2.87* -- 3.10* --  CKTOTAL -- 110 127 114 -- --  CKMB -- 11.0* 15.1* 14.1* -- --  TROPONINI -- 3.51* 3.84* 3.00* -- --   Estimated Creatinine Clearance: 14.1 ml/min (by C-G formula based on Cr of 2.89).   Medications:  Prescriptions prior to admission  Medication Sig Dispense Refill  . Amylase-Lipase-Protease (PANCREASE MT 10 PO) Take 10 mg by mouth daily.        . cetirizine (ZYRTEC) 10 MG tablet Take 10 mg by mouth daily.        . furosemide (LASIX) 40 MG tablet Take 40 mg by mouth 2 (two) times daily.        . hydrALAZINE (APRESOLINE) 25 MG tablet Take 25 mg by mouth 3 (three) times daily.        Marland Kitchen HYDROcodone-acetaminophen (NORCO) 5-325 MG per tablet Take 1 tablet by mouth every 6 (six) hours as needed. For pain       . insulin glargine (LANTUS SOLOSTAR) 100 UNIT/ML injection Inject 8 Units into the skin at bedtime.        . insulin regular (HUMULIN R,NOVOLIN R) 100 units/mL injection Inject 1-2 Units into the skin 3 (three) times daily before meals. Per sliding scale       . levothyroxine (SYNTHROID, LEVOTHROID) 125 MCG tablet Take 125 mcg by mouth daily.        . nebivolol (BYSTOLIC) 5 MG tablet Take 5 mg by mouth  daily.        . nitroGLYCERIN (NITROSTAT) 0.4 MG SL tablet Place 0.4 mg under the tongue every 5 (five) minutes as needed. For chest pain       . oxycodone (OXY-IR) 5 MG capsule Take 5 mg by mouth every 4 (four) hours as needed. For pain       . oxyCODONE (OXYCONTIN) 10 MG 12 hr tablet Take 10 mg by mouth every 12 (twelve) hours.        . pantoprazole (PROTONIX) 40 MG tablet Take 80 mg by mouth daily.        Marland Kitchen warfarin (COUMADIN) 2 MG tablet Take 1-2 mg by mouth daily. As directed by home meter       Assessment: 87yom admitted for CHF exacerbation continues on home coumadin for  Hx PE/PAF. INR of 2.43 is therapeutic.  Goal of Therapy:  INR 2-3   Plan:  1) Continue home coumadin regimen: 2mg  M/Tue/W and 1mg  all other days  Fredrik Rigger 02/03/2011,10:36 AM

## 2011-02-03 NOTE — Progress Notes (Signed)
Subjective: Patient feeling better. States breathing is a little bit easier. His only complaint is that he would like to be set up in the chair for meals rather than laying in bed. No chest pain.  Objective: Weight change: -1.6 kg (-3 lb 8.4 oz)  Intake/Output Summary (Last 24 hours) at 02/03/11 1705 Last data filed at 02/03/11 0900  Gross per 24 hour  Intake    360 ml  Output    675 ml  Net   -315 ml   BP 128/46  Pulse 72  Temp(Src) 97.6 F (36.4 C) (Oral)  Resp 18  Ht 5\' 8"  (1.727 m)  Wt 53.2 kg (117 lb 4.6 oz)  BMI 17.83 kg/m2  SpO2 90% General: Alert and oriented x3, no apparent distress, appears stated age Cardiovascular: Irregular rhythm, rate controlled, Lungs: Bilateral end expiratory wheezing, although this is improved from the previous day. Abdomen: Soft, nontender, nondistended, hypoactive bowel sounds Extremities: Trace pitting edema, emaciated S. patient is paralyzed.  Lab Results: Basic Metabolic Panel:  Basename 02/03/11 0550 02/02/11 1042  NA 141 138  K 3.9 3.4*  CL 110 107  CO2 18* 18*  GLUCOSE 146* 297*  BUN 90* 90*  CREATININE 2.89* 2.87*  CALCIUM 7.4* 7.3*  MG -- 2.0  PHOS -- --   Liver Function Tests:  [facility] 02/02/11 1042  AST 19  ALT 7  ALKPHOS 72  BILITOT 0.2*  PROT 6.5  ALBUMIN 2.3*   CBC:  Basename 02/03/11 0550 02/02/11 1042 02/01/11 2253  WBC 7.4 10.4 --  NEUTROABS -- -- 9.1*  HGB 7.6* 8.9* --  HCT 24.0* 27.0* --  MCV 95.6 94.7 --  PLT 214 233 --   Cardiac Enzymes:  Basename 02/03/11 0900 02/02/11 2022 02/02/11 1042  CKTOTAL 80 110 127  CKMB 6.7* 11.0* 15.1*  CKMBINDEX -- -- --  TROPONINI 1.71* 3.51* 3.84*   BNP:  Basename 02/01/11 2254  POCBNP 14522.0*   CBG:  Basename 02/03/11 1604 02/03/11 1059 02/03/11 0701 02/03/11 0011 02/02/11 2140 02/02/11 1614  GLUCAP 242* 124* 221* 156* 201* 203*   Coagulation:  Basename 02/03/11 0550 02/02/11 1042  LABPROT 26.8* 24.0*  INR 2.43* 2.11*      Medications: Scheduled Meds:    . furosemide  40 mg Intravenous BID  . insulin aspart  0-15 Units Subcutaneous TID WC  . insulin aspart  0-5 Units Subcutaneous QHS  . insulin glargine  8 Units Subcutaneous QHS  . isosorbide-hydrALAZINE  1 tablet Oral TID  . lipase/protease/amylase  1 capsule Oral TID WC  . loratadine  10 mg Oral Daily  . nebivolol  5 mg Oral Daily  . nitroGLYCERIN  1 inch Topical Q6H  . oxyCODONE  10 mg Oral BID  . pantoprazole  40 mg Oral Q lunch  . sodium chloride  3 mL Intravenous Q12H  . warfarin  1 mg Oral Custom  . warfarin  2 mg Oral Custom  . DISCONTD: hydrALAZINE  25 mg Oral TID   Continuous Infusions:  PRN Meds:.acetaminophen, acetaminophen, HYDROcodone-acetaminophen, ondansetron (ZOFRAN) IV, ondansetron  Assessment/Plan: Patient Active Hospital Problem List: NSTEMI (non-ST elevated myocardial infarction) (02/02/2011)  seen by [facility] cardiology. Given advanced age and multiple comorbidities, not a vascularization candidate. Will optimize medical management. Currently already anticoagulated plus on nitro paste.  Patient started on Bidil yesterday. Dr. Mayford Knife looking into long-term oral beta blocker.  CHF (congestive heart failure) (02/02/2011)  continue diuresis.  CKD (chronic kidney disease) (02/02/2011)  stable.  PAF (paroxysmal atrial fibrillation) (02/02/2011)  rate controlled and INR therapeutic  DM (diabetes mellitus) type II uncontrolled with renal manifestation (02/02/2011)  sugar is likely rising because of MI.  Will increase his Lantus from 8 to 10 units.   LOS: 2 days   Kue Fox K 02/03/2011, 5:05 PM

## 2011-02-03 NOTE — Progress Notes (Signed)
SUBJECTIVE:  The patient denies any chest pain.  He says his SOB has improved since admission.  OBJECTIVE:   Vitals:   Filed Vitals:   02/02/11 1808 02/02/11 2100 02/03/11 0300 02/03/11 0603  BP: 151/53 133/65  132/50  Pulse:  73  72  Temp:  97.8 F (36.6 C)  97.1 F (36.2 C)  TempSrc:  Oral  Oral  Resp:  18  16  Height:      Weight:   53.2 kg (117 lb 4.6 oz)   SpO2:  91%  92%   I&O's:   Intake/Output Summary (Last 24 hours) at 02/03/11 1153 Last data filed at 02/03/11 0900  Gross per 24 hour  Intake    580 ml  Output   1276 ml  Net   -696 ml   TELEMETRY: Reviewed telemetry pt in NSR:     PHYSICAL EXAM General: Well developed, well nourished, in no acute distress Head: Eyes PERRLA, No xanthomas.   Normal cephalic and atramatic  Lungs:   Clear bilaterally to auscultation and percussion. Heart:   HRRR S1 S2 Pulses are 2+ & equal.  2/6 SM at RUSB            No carotid bruit. No JVD.  No abdominal bruits. No femoral bruits. Abdomen: Bowel sounds are positive, abdomen soft and non-tender without masses or                  Hernia's noted. Msk:  Back normal, normal gait. Normal strength and tone for age. Extremities:   No clubbing, cyanosis or edema.  DP +1 Neuro: Alert and oriented X 3. Psych:  Good affect, responds appropriately   LABS: Basic Metabolic Panel:  Basename 02/03/11 0550 02/02/11 1042  NA 141 138  K 3.9 3.4*  CL 110 107  CO2 18* 18*  GLUCOSE 146* 297*  BUN 90* 90*  CREATININE 2.89* 2.87*  CALCIUM 7.4* 7.3*  MG -- 2.0  PHOS -- --   Liver Function Tests:  [facility] 02/02/11 1042  AST 19  ALT 7  ALKPHOS 72  BILITOT 0.2*  PROT 6.5  ALBUMIN 2.3*    CBC:  Basename 02/03/11 0550 02/02/11 1042 02/01/11 2253  WBC 7.4 10.4 --  NEUTROABS -- -- 9.1*  HGB 7.6* 8.9* --  HCT 24.0* 27.0* --  MCV 95.6 94.7 --  PLT 214 233 --   Cardiac Enzymes:  Basename 02/03/11 0900 02/02/11 2022 02/02/11 1042  CKTOTAL 80 110 127  CKMB 6.7* 11.0* 15.1*    CKMBINDEX -- -- --  TROPONINI 1.71* 3.51* 3.84*   BNP:  Basename 02/01/11 2254  POCBNP 14522.0*   Thyroid Function Tests:  Basename 02/02/11 1042  TSH 1.910  T4TOTAL --  T3FREE --  THYROIDAB --   Coag Panel:   Lab Results  Component Value Date   INR 2.43* 02/03/2011   INR 2.11* 02/02/2011   INR 1.74* 03/29/2010    RADIOLOGY: Dg Chest Port 1 View  02/02/2011  *RADIOLOGY REPORT*  Clinical Data: CHF and shortness of breath.  PORTABLE CHEST - 1 VIEW  Comparison: 03/28/2010  Findings: Moderate pulmonary edema present with probable component of right pleural fluid.  Stable cardiomegaly.  IMPRESSION: Moderate pulmonary edema and probable right pleural effusion.  Original Report Authenticated By: Reola Calkins, M.D.      ASSESSMENT:  1.  Acute on chronic diastolic HF improved. 2.  NSTEMI type II secondary to #1 with no chest pain 3.  Significant anemia which is probably  contributing to his CHF 4.  DM 5.  HTN 6.  PAF on Coumadin 7.  History of PE 8.  Chronic kidney disease stage IV  PLAN:   1.  Agree with Dr. Michaelle Copas recommendations.  He is not a candidate for further cardiac interventions and therefore would try to optimize heart failure meds. 2.  Bidil started yesterday 3.  Continue IV Lasix 4.  Check BMET in am 5.  He had been on a low dose beta blocker in the past so will try to find out why he is no longer on one [name], MD  02/03/2011  11:53 AM

## 2011-02-04 DIAGNOSIS — B370 Candidal stomatitis: Secondary | ICD-10-CM | POA: Diagnosis present

## 2011-02-04 DIAGNOSIS — E039 Hypothyroidism, unspecified: Secondary | ICD-10-CM | POA: Diagnosis present

## 2011-02-04 DIAGNOSIS — IMO0002 Reserved for concepts with insufficient information to code with codable children: Secondary | ICD-10-CM | POA: Diagnosis present

## 2011-02-04 DIAGNOSIS — Z436 Encounter for attention to other artificial openings of urinary tract: Secondary | ICD-10-CM

## 2011-02-04 DIAGNOSIS — Z936 Other artificial openings of urinary tract status: Secondary | ICD-10-CM

## 2011-02-04 DIAGNOSIS — L89159 Pressure ulcer of sacral region, unspecified stage: Secondary | ICD-10-CM | POA: Diagnosis present

## 2011-02-04 DIAGNOSIS — S129XXA Fracture of neck, unspecified, initial encounter: Secondary | ICD-10-CM | POA: Diagnosis present

## 2011-02-04 DIAGNOSIS — R1310 Dysphagia, unspecified: Secondary | ICD-10-CM | POA: Diagnosis present

## 2011-02-04 DIAGNOSIS — G8220 Paraplegia, unspecified: Secondary | ICD-10-CM | POA: Diagnosis present

## 2011-02-04 LAB — CBC
Hemoglobin: 9 g/dL — ABNORMAL LOW (ref 13.0–17.0)
MCH: 31.1 pg (ref 26.0–34.0)
RBC: 2.89 MIL/uL — ABNORMAL LOW (ref 4.22–5.81)

## 2011-02-04 LAB — GLUCOSE, CAPILLARY
Glucose-Capillary: 162 mg/dL — ABNORMAL HIGH (ref 70–99)
Glucose-Capillary: 329 mg/dL — ABNORMAL HIGH (ref 70–99)
Glucose-Capillary: 290 mg/dL — ABNORMAL HIGH (ref 70–99)
Glucose-Capillary: 353 mg/dL — ABNORMAL HIGH (ref 70–99)
Glucose-Capillary: 290 mg/dL — ABNORMAL HIGH (ref 70–99)

## 2011-02-04 LAB — PROTIME-INR: Prothrombin Time: 25.6 seconds — ABNORMAL HIGH (ref 11.6–15.2)

## 2011-02-04 LAB — BASIC METABOLIC PANEL
CO2: 19 mEq/L (ref 19–32)
Calcium: 7.8 mg/dL — ABNORMAL LOW (ref 8.4–10.5)
Glucose, Bld: 140 mg/dL — ABNORMAL HIGH (ref 70–99)
Potassium: 4 mEq/L (ref 3.5–5.1)
Sodium: 143 mEq/L (ref 135–145)

## 2011-02-04 LAB — CARDIAC PANEL(CRET KIN+CKTOT+MB+TROPI): CK, MB: 5.3 ng/mL — ABNORMAL HIGH (ref 0.3–4.0)

## 2011-02-04 MED ORDER — NEBIVOLOL HCL 5 MG PO TABS
5.0000 mg | ORAL_TABLET | Freq: Every day | ORAL | Status: DC
  Filled 2011-02-04: qty 1

## 2011-02-04 MED ORDER — NYSTATIN 100000 UNIT/ML MT SUSP
5.0000 mL | Freq: Four times a day (QID) | OROMUCOSAL | Status: DC
  Administered 2011-02-04 – 2011-02-09 (×17): 500000 [IU] via ORAL
  Filled 2011-02-04 (×22): qty 5

## 2011-02-04 MED ORDER — INSULIN GLARGINE 100 UNIT/ML ~~LOC~~ SOLN
15.0000 [IU] | Freq: Every day | SUBCUTANEOUS | Status: DC
  Administered 2011-02-04 – 2011-02-08 (×3): 15 [IU] via SUBCUTANEOUS
  Filled 2011-02-04: qty 3

## 2011-02-04 MED ORDER — PANTOPRAZOLE SODIUM 40 MG PO TBEC
80.0000 mg | DELAYED_RELEASE_TABLET | Freq: Every day | ORAL | Status: DC
  Administered 2011-02-04 – 2011-02-09 (×6): 80 mg via ORAL
  Filled 2011-02-04 (×4): qty 2
  Filled 2011-02-04: qty 1
  Filled 2011-02-04: qty 2

## 2011-02-04 MED ORDER — LEVOTHYROXINE SODIUM 125 MCG PO TABS
125.0000 ug | ORAL_TABLET | Freq: Every day | ORAL | Status: DC
  Administered 2011-02-04 – 2011-02-09 (×6): 125 ug via ORAL
  Filled 2011-02-04 (×6): qty 1

## 2011-02-04 MED ORDER — STARCH (THICKENING) PO POWD
ORAL | Status: DC | PRN
  Filled 2011-02-04 (×2): qty 227

## 2011-02-04 NOTE — Consults (Signed)
WOC consult Note Reason for Consult: req to eval pt for wound care for sacral pressure ulcer.  Pt has hx of sacral pressure ulcer x several years treated at home per wife.   Wound type: Pressure ulcer, healing Stage III   Pressure Ulcer POA: Yes  Measurement: 4cm x 2cm  With no depth, completely re epithelialized, with some maceration of the tissue secondary to fecal material  Wound bed: macerated tissue only  Drainage (amount, consistency, odor) none  Periwound: intact with larger area of healed tissue, apparent that this wound has been much bigger at some point.   Dressing procedure/placement/frequency: silicone foam dressing to be applied to sacrum, changed every 3 days and PRN.  Air mattress overlay for bed.  Chair cushion when sitting up.  Re consult if needed, will not follow at this time. Thanks  Edvin Albus [employer], CWOCN [phone])

## 2011-02-04 NOTE — Progress Notes (Signed)
ANTICOAGULATION CONSULT NOTE - Follow Up Consult  Pharmacy Consult for Coumadin Indication: hx PE, PAF  Allergies  Allergen Reactions  . Diltiazem Anaphylaxis  . Sulfa Antibiotics Rash    Patient Measurements: Height: 5\' 8"  (172.7 cm) Weight: 120 lb 5.9 oz (54.6 kg) IBW/kg (Calculated) : 68.4   Vital Signs: Temp: 97.8 F (36.6 C) (11/28 0659) Temp src: Oral (11/28 0659) BP: 152/57 mmHg (11/28 0659) Pulse Rate: 67  (11/28 0659)  Labs:  Basename 02/04/11 0540 02/04/11 0110 02/03/11 1629 02/03/11 0900 02/03/11 0550 02/02/11 1042  HGB 9.0* -- -- -- 7.6* --  HCT 27.5* -- -- -- 24.0* 27.0*  PLT 257 -- -- -- 214 233  APTT -- -- -- -- -- --  LABPROT 25.6* -- -- -- 26.8* 24.0*  INR 2.29* -- -- -- 2.43* 2.11*  HEPARINUNFRC -- -- -- -- -- --  CREATININE 2.90* -- -- -- 2.89* 2.87*  CKTOTAL -- 64 73 80 -- --  CKMB -- 5.3* 6.3* 6.7* -- --  TROPONINI -- 1.23* 1.30* 1.71* -- --   Estimated Creatinine Clearance: 14.4 ml/min (by C-G formula based on Cr of 2.9).   Medications:  Prescriptions prior to admission  Medication Sig Dispense Refill  . Amylase-Lipase-Protease (PANCREASE MT 10 PO) Take 10 mg by mouth daily.        . cetirizine (ZYRTEC) 10 MG tablet Take 10 mg by mouth daily.        . furosemide (LASIX) 40 MG tablet Take 40 mg by mouth 2 (two) times daily.        . hydrALAZINE (APRESOLINE) 25 MG tablet Take 25 mg by mouth 3 (three) times daily.        Marland Kitchen HYDROcodone-acetaminophen (NORCO) 5-325 MG per tablet Take 1 tablet by mouth every 6 (six) hours as needed. For pain       . insulin glargine (LANTUS SOLOSTAR) 100 UNIT/ML injection Inject 8 Units into the skin at bedtime.        . insulin regular (HUMULIN R,NOVOLIN R) 100 units/mL injection Inject 1-2 Units into the skin 3 (three) times daily before meals. Per sliding scale       . levothyroxine (SYNTHROID, LEVOTHROID) 125 MCG tablet Take 125 mcg by mouth daily.        . nebivolol (BYSTOLIC) 5 MG tablet Take 5 mg by mouth  daily.        . nitroGLYCERIN (NITROSTAT) 0.4 MG SL tablet Place 0.4 mg under the tongue every 5 (five) minutes as needed. For chest pain       . oxycodone (OXY-IR) 5 MG capsule Take 5 mg by mouth every 4 (four) hours as needed. For pain       . oxyCODONE (OXYCONTIN) 10 MG 12 hr tablet Take 10 mg by mouth every 12 (twelve) hours.        . pantoprazole (PROTONIX) 40 MG tablet Take 80 mg by mouth daily.        Marland Kitchen warfarin (COUMADIN) 2 MG tablet Take 1-2 mg by mouth daily. As directed by home meter       Assessment: 87yom admitted for CHF exacerbation continues on home coumadin for  Hx PE/PAF. INR therapeutic at 2.29 .  Goal of Therapy:  INR 2-3   Plan:  1) Continue home coumadin regimen:  1mg  PO daily except 2mg  on Mon, Tues, Wed. 2)  Decrease INR to MWF.   Laney Potash, Mohid Furuya Dien 02/04/2011,10:30 AM

## 2011-02-04 NOTE — Progress Notes (Signed)
SUBJECTIVE:  Breathing better. Denies and chest pain  OBJECTIVE:   Vitals:   Filed Vitals:   02/03/11 0603 02/03/11 1403 02/03/11 2107 02/04/11 0659  BP: 132/50 128/46 149/56 152/57  Pulse: 72 72 70 67  Temp: 97.1 F (36.2 C) 97.6 F (36.4 C) 97.2 F (36.2 C) 97.8 F (36.6 C)  TempSrc: Oral Oral Oral Oral  Resp: 16 18 18 18   Height:      Weight:    54.6 kg (120 lb 5.9 oz)  SpO2: 92% 90% 91% 92%   I&O's:   Intake/Output Summary (Last 24 hours) at 02/04/11 1306 Last data filed at 02/04/11 0020  Gross per 24 hour  Intake    240 ml  Output    775 ml  Net   -535 ml   TELEMETRY: Reviewed telemetry pt in NSR:     PHYSICAL EXAM General: Well developed, well nourished, in no acute distress Head: Eyes PERRLA, No xanthomas.   Normal cephalic and atramatic  Lungs:   Scattered wheezes. Heart:   HRRR S1 S2 Pulses are 2+ & equal.            No carotid bruit. No JVD.  No abdominal bruits. No femoral bruits. Abdomen: Bowel sounds are positive, abdomen soft and non-tender without masses or                  Hernia's noted. Msk:  Back normal, normal gait. Normal strength and tone for age. Extremities:   No clubbing, cyanosis or edema.  DP +1 Neuro: Alert and oriented X 3. Psych:  Good affect, responds appropriately   LABS: Basic Metabolic Panel:  Basename 02/04/11 0540 02/03/11 0550 02/02/11 1042  NA 143 141 --  K 4.0 3.9 --  CL 111 110 --  CO2 19 18* --  GLUCOSE 140* 146* --  BUN 94* 90* --  CREATININE 2.90* 2.89* --  CALCIUM 7.8* 7.4* --  MG -- -- 2.0  PHOS -- -- --   Liver Function Tests:  Basename 02/02/11 1042  AST 19  ALT 7  ALKPHOS 72  BILITOT 0.2*  PROT 6.5  ALBUMIN 2.3*   No results found for this basename: LIPASE:2,AMYLASE:2 in the last 72 hours CBC:  Basename 02/04/11 0540 02/03/11 0550 02/01/11 2253  WBC 10.4 7.4 --  NEUTROABS -- -- 9.1*  HGB 9.0* 7.6* --  HCT 27.5* 24.0* --  MCV 95.2 95.6 --  PLT 257 214 --   Cardiac Enzymes:  Basename  02/04/11 0110 02/03/11 1629 02/03/11 0900  CKTOTAL 64 73 80  CKMB 5.3* 6.3* 6.7*  CKMBINDEX -- -- --  TROPONINI 1.23* 1.30* 1.71*   BNP:  Basename 02/01/11 2254  POCBNP 14522.0*   Thyroid Function Tests:  Basename 02/02/11 1042  TSH 1.910  T4TOTAL --  T3FREE --  THYROIDAB --   Coag Panel:   Lab Results  Component Value Date   INR 2.29* 02/04/2011   INR 2.43* 02/03/2011   INR 2.11* 02/02/2011    RADIOLOGY: Dg Chest Port 1 View  02/02/2011  *RADIOLOGY REPORT*  Clinical Data: CHF and shortness of breath.  PORTABLE CHEST - 1 VIEW  Comparison: 03/28/2010  Findings: Moderate pulmonary edema present with probable component of right pleural fluid.  Stable cardiomegaly.  IMPRESSION: Moderate pulmonary edema and probable right pleural effusion.  Original Report Authenticated By: Reola Calkins, M.D.      ASSESSMENT:  1.  Acute on chronic diastolic HF improving 2.  NSTEMI type II secondary to #1  with no chest pain 3.  Severe anemia which is contributing to CHF 4.  HTN 5. DM 6. PAF on Coumadin 7.  History of PE 8.  CHronic kidney disease stage IV.  PLAN:   1.  Continue Bidil, IV LAsix 2.  Check BMET and DDimer in am 3.  Still needs more aggressive diuresis.  His weight is actually up despite O>I.  [name], MD  02/04/2011  1:06 PM

## 2011-02-04 NOTE — Progress Notes (Signed)
Subjective: Multiple complains today  - dry mouth - dysphagia - dyspnea - wound care -sacral pain   Objective: Weight change: 1.4 kg (3 lb 1.4 oz)  Intake/Output Summary (Last 24 hours) at 02/04/11 1643 Last data filed at 02/04/11 1337  Gross per 24 hour  Intake    480 ml  Output    775 ml  Net   -295 ml   BP 145/52  Pulse 87  Temp(Src) 96.8 F (36 C) (Oral)  Resp 18  Ht 5\' 8"  (1.727 m)  Wt 54.6 kg (120 lb 5.9 oz)  BMI 18.30 kg/m2  SpO2 91% General: Alert and oriented x3, no apparent distress, appears stated age Cardiovascular: Irregular rhythm, rate controlled, Lungs: patient with paradoxical breathing and rhonchi Abdomen: Soft, nontender, nondistended, hypoactive bowel sounds Extremities: Trace pitting edema, emaciated S. patient is paralyzed.  Lab Results: Basic Metabolic Panel:  Basename 02/04/11 0540 02/03/11 0550 02/02/11 1042  NA 143 141 --  K 4.0 3.9 --  CL 111 110 --  CO2 19 18* --  GLUCOSE 140* 146* --  BUN 94* 90* --  CREATININE 2.90* 2.89* --  CALCIUM 7.8* 7.4* --  MG -- -- 2.0  PHOS -- -- --   Liver Function Tests:  [facility] 02/02/11 1042  AST 19  ALT 7  ALKPHOS 72  BILITOT 0.2*  PROT 6.5  ALBUMIN 2.3*   CBC:  Basename 02/04/11 0540 02/03/11 0550 02/01/11 2253  WBC 10.4 7.4 --  NEUTROABS -- -- 9.1*  HGB 9.0* 7.6* --  HCT 27.5* 24.0* --  MCV 95.2 95.6 --  PLT 257 214 --   Cardiac Enzymes:  Basename 02/04/11 0110 02/03/11 1629 02/03/11 0900  CKTOTAL 64 73 80  CKMB 5.3* 6.3* 6.7*  CKMBINDEX -- -- --  TROPONINI 1.23* 1.30* 1.71*   BNP:  Basename 02/01/11 2254  POCBNP 14522.0*   CBG:  Basename 02/04/11 1606 02/04/11 1119 02/04/11 0630 02/03/11 2210 02/03/11 1604 02/03/11 1059  GLUCAP 290* 329* 162* 379* 242* 124*   Coagulation:  Basename 02/04/11 0540 02/03/11 0550  LABPROT 25.6* 26.8*  INR 2.29* 2.43*     Medications: Scheduled Meds:    . furosemide  40 mg Intravenous BID  . insulin aspart  0-15 Units  Subcutaneous TID WC  . insulin aspart  0-5 Units Subcutaneous QHS  . insulin glargine  15 Units Subcutaneous QHS  . isosorbide-hydrALAZINE  1 tablet Oral TID  . levothyroxine  125 mcg Oral Daily  . lipase/protease/amylase  1 capsule Oral TID WC  . loratadine  10 mg Oral Daily  . nebivolol  5 mg Oral Daily  . nystatin  5 mL Oral QID  . oxyCODONE  10 mg Oral BID  . pantoprazole  80 mg Oral Daily  . sodium chloride  3 mL Intravenous Q12H  . warfarin  1 mg Oral Custom  . warfarin  2 mg Oral Custom  . DISCONTD: insulin glargine  10 Units Subcutaneous QHS  . DISCONTD: insulin glargine  8 Units Subcutaneous QHS  . DISCONTD: nebivolol  5 mg Oral Daily  . DISCONTD: nitroGLYCERIN  1 inch Topical Q6H  . 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

## 2011-02-04 NOTE — Progress Notes (Signed)
Utilization review complete 

## 2011-02-04 NOTE — Progress Notes (Signed)
Speech Language/Pathology Clinical/Bedside Swallow Evaluation Patient Details  Name: William French MRN: [id number] DOB: 1924/02/02 Today's Date: 02/04/2011  Past Medical History:  Past Medical History  Diagnosis Date  . Diabetes mellitus   . CHF (congestive heart failure)   . Hypertension   . Dysrhythmia    Past Surgical History:  Past Surgical History  Procedure Date  . Leg amputation above knee   . Coronary stent placement     Assessment/Recommendations/Treatment Plan    SLP Assessment Clinical Impression Statement: Pt presents with mild-moderate oropharyngeal dysphagia secondary to delay in swallow. Swallowing functioning consistent with previous MBS results. Pt displayed no overt s/s of aspiration at bedside. C/o difficulty initiating swallow with solids secondary to dry oral cavity (?secondary to new medication). Given history of dysphagia, recommend continued diet of dysphagia 3 (mechanical soft) diet and nectar thick liquids with strict use of chin tuck.  Dysphagia is chronic in nature, current diet likely to be long term.  Risk for Aspiration: Mild Other Related Risk Factors: History of dysphagia;History of GERD  Recommendations Solid Consistency: Dysphagia 3 (Mechanical soft) Liquid Consistency: Nectar (With use of chin tuck) Liquid Administration via: Cup Medication Administration: Whole meds with puree Supervision: Intermittent supervision to cue for compensatory strategies Compensations: Slow rate;Small sips/bites;Follow solids with liquid Postural Changes and/or Swallow Maneuvers: Out of bed for meals;Seated upright 90 degrees;Upright 30-60 min after meal;Chin tuck Other Recommendations: Order thickener from pharmacy;Prohibited food (jello, ice cream, thin soups);Remove water pitcher  Prognosis Prognosis for Safe Diet Advancement:  (Chronic dysphagia, pt at baseline) Barriers to Reach Goals: Severity of dysphagia  Individuals Consulted Consulted and Agree  with Results and Recommendations: Patient;Family member/caregiver Family Member Consulted: spouse  Swallow Study Goals  SLP Swallowing Goals Patient will consume recommended diet without observed clinical signs of aspiration with: Modified independent assistance Swallow Study Goal #1 - Progress: Progressing toward goal Patient will utilize recommended strategies during swallow to increase swallowing safety with: Modified independent assistance Swallow Study Goal #2 - Progress: Progressing toward goal  Swallow Study Prior Functional Status   Patient on current diet at home and reports tolerance.   General  Date of Onset: 02/01/11 Other Pertinent Information: Previous MBS- 03/27/10: Dys3 (mech soft), Nectar thick liquids with chin tuck Type of Study: Bedside swallow evaluation Diet Prior to this Study: Dysphagia 3 (soft);Nectar-thick liquids;Other (Comment) (Chin tuck with liquids) Temperature Spikes Noted: No Respiratory Status: Supplemental O2 delivered via (comment) History of Intubation: No Behavior/Cognition: Alert;Cooperative;Pleasant mood Oral Cavity - Dentition: Dentures, top;Dentures, bottom Vision: Functional for self-feeding Patient Positioning: Upright in bed Baseline Vocal Quality: Hoarse;Other (comment) (SOB) Volitional Cough: Weak Volitional Swallow: Able to elicit Ice chips: Not tested  Oral Motor/Sensory Function  Labial ROM: Within Functional Limits Labial Symmetry: Within Functional Limits Labial Strength: Within Functional Limits Labial Sensation: Within Functional Limits Lingual ROM: Within Functional Limits Lingual Symmetry: Within Functional Limits Lingual Strength: Within Functional Limits Lingual Sensation: Within Functional Limits Facial ROM: Within Functional Limits Facial Symmetry: Within Functional Limits Facial Strength: Within Functional Limits Facial Sensation: Within Functional Limits Velum: Within Functional Limits Mandible: Within  Functional Limits  Consistency Results  Ice Chips Ice chips: Not tested  Thin Liquid Thin Liquid: Not tested  Nectar Thick Liquid Nectar Thick Liquid: Impaired Presentation: Cup;Self Fed Pharyngeal Phase Impairments: Delayed Swallow;Other (comments) (use of chin tuck )  Honey Thick Liquid Honey Thick Liquid: Not tested  Puree Puree: Impaired Presentation: Spoon;Self Fed Pharyngeal Phase Impairments: Delayed Swallow  Solid Solid: Impaired Presentation: Self Fed Oral  Phase Impairments: Impaired anterior to posterior transit (Secondary to dry oral cavity)   Chyrel Masson, Speech Pathology Student 02/04/2011,4:48 PM

## 2011-02-05 LAB — GLUCOSE, CAPILLARY
Glucose-Capillary: 67 mg/dL — ABNORMAL LOW (ref 70–99)
Glucose-Capillary: 233 mg/dL — ABNORMAL HIGH (ref 70–99)
Glucose-Capillary: 263 mg/dL — ABNORMAL HIGH (ref 70–99)
Glucose-Capillary: 72 mg/dL (ref 70–99)
Glucose-Capillary: 154 mg/dL — ABNORMAL HIGH (ref 70–99)

## 2011-02-05 LAB — BASIC METABOLIC PANEL
CO2: 21 mEq/L (ref 19–32)
Calcium: 7.7 mg/dL — ABNORMAL LOW (ref 8.4–10.5)
Glucose, Bld: 72 mg/dL (ref 70–99)
Sodium: 142 mEq/L (ref 135–145)

## 2011-02-05 MED ORDER — FOOD THICKENER (THICKENUP CLEAR)
1.0000 | ORAL | Status: DC | PRN
  Filled 2011-02-05: qty 1.4

## 2011-02-05 NOTE — Progress Notes (Signed)
Inpatient Diabetes Program Recommendations  AACE/ADA: New Consensus Statement on Inpatient Glycemic Control (2009)  Target Ranges:  Prepandial:   less than 140 mg/dL      Peak postprandial:   less than 180 mg/dL (1-2 hours)      Critically ill patients:  140 - 180 mg/dL   Reason for Visit: Hypoglycemia this a.m. CBG=67 and elevated post-prandials 200-300  Inpatient Diabetes Program Recommendations Insulin - Basal: decrease Lantus to 10 units  Correction (SSI): decrease to sensitive if starting meal coverage Insulin - Meal Coverage: add 3 units with meals and titrate up for elevated post-prandials

## 2011-02-05 NOTE — Progress Notes (Signed)
Utilization review complete 

## 2011-02-05 NOTE — Progress Notes (Addendum)
Subjective: Improved. Breathing better from admit. Not a dramatic improvement he states however. No CP, no falls, no syncope.    Objective:  Vital Signs in the last 24 hours: Temp:  [96.8 F (36 C)-98.2 F (36.8 C)] 98 F (36.7 C) (11/29 0543) Pulse Rate:  [69-87] 69  (11/29 0543) Resp:  [18] 18  (11/29 0543) BP: (132-168)/(52-69) 132/55 mmHg (11/29 0543) SpO2:  [91 %-99 %] 94 % (11/29 0543) Weight:  [51.71 kg (114 lb)] 114 lb (51.71 kg) (11/29 0543)  Intake/Output from previous day: 11/28 0701 - 11/29 0700 In: 240 [P.O.:240] Out: 501 [Urine:500; Stool:1]   Physical Exam: General: Well developed, well nourished, in no acute distress. Elderly Head:  Normocephalic and atraumatic. Lungs: Clear to auscultation and percussion.Mild scattered wheeze. Heart: Normal S1 and S2.  No murmur, rubs or gallops.  Pulses: Pulses normal in all 4 extremities. Extremities: No clubbing or cyanosis. No edema. Neurologic: Alert and oriented x 3.    Lab Results:  Basename 02/04/11 0540 02/03/11 0550  WBC 10.4 7.4  HGB 9.0* 7.6*  PLT 257 214    Basename 02/05/11 0553 02/04/11 0540  NA 142 143  K 4.0 4.0  CL 110 111  CO2 21 19  GLUCOSE 72 140*  BUN 94* 94*  CREATININE 3.00* 2.90*    Basename 02/04/11 0110 02/03/11 1629  TROPONINI 1.23* 1.30*   Hepatic Function Panel No results found for this basename: PROT,ALBUMIN,AST,ALT,ALKPHOS,BILITOT,BILIDIR,IBILI in the last 72 hours        Assessment/Plan:  Principal Problem:  *NSTEMI (non-ST elevated myocardial infarction) Active Problems:  CHF (congestive heart failure)  History of pulmonary embolus (PE)  CKD (chronic kidney disease)  PAF (paroxysmal atrial fibrillation)  DM (diabetes mellitus) type II uncontrolled with renal manifestation  Cervical spine fracture  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 No chest pain. Hopeful change to PO tomorrow.  -Creat now 3.0 from 2.9. CKD - No changes in medication  -Coumadin theraputic.  - TELE with 12 beat run of NSVT. Asymp. Continue nebivolol. DNR Jaziah Kwasnik 02/05/2011, 12:01 PM

## 2011-02-05 NOTE — Progress Notes (Signed)
Speech Pathology: Dysphagia Treatment Note  Patient was observed with : oatmeal and nectar thick orange juice.  Patient was noted to have s/s of aspiration : No:    Lung Sounds:  clear Temperature: 98.0  Clinical Impression: Pt observed to implement all precautions and compensatory strategies independently while consuming breakfast. Pt consistently used chin tuck with small sips of nectar thick liquid and used intermittent throat clears with a dry swallow. SLP provided education on preparing thickened liquids properly for when pt returns home. At this time, pt appears appropriate to continue current diet.   Recommendations:  Continue current diet of Dys 3 (mech soft) with nectar thick liquids.   Pain:   none Intervention Required:   No  Goals: Progressing towards goals  Chyrel Masson, Speech Pathology Student  02/05/2011

## 2011-02-05 NOTE — Progress Notes (Signed)
Subjective: Feels better today - decrease mouth and sacral pain. Dyspnea still present   Objective: Weight change: -2.89 kg (-6 lb 5.9 oz)  Intake/Output Summary (Last 24 hours) at 02/05/11 1408 Last data filed at 02/05/11 1100  Gross per 24 hour  Intake      0 ml  Output    801 ml  Net   -801 ml   BP 79/35  Pulse 64  Temp(Src) 97.6 F (36.4 C) (Oral)  Resp 18  Ht 5\' 8"  (1.727 m)  Wt 51.71 kg (114 lb)  BMI 17.33 kg/m2  SpO2 93% General: Alert and oriented x3, no apparent distress, appears stated age Cardiovascular: Irregular rhythm, rate controlled, Lungs: patient with paradoxical breathing and rhonchi Abdomen: Soft, nontender, nondistended, hypoactive bowel sounds Extremities: Trace pitting edema, emaciated S. patient is paralyzed.  Lab Results: Basic Metabolic Panel:  Basename 02/05/11 0553 02/04/11 0540  NA 142 143  K 4.0 4.0  CL 110 111  CO2 21 19  GLUCOSE 72 140*  BUN 94* 94*  CREATININE 3.00* 2.90*  CALCIUM 7.7* 7.8*  MG -- --  PHOS -- --   Liver Function Tests: No results found for this basename: AST:2,ALT:2,ALKPHOS:2,BILITOT:2,PROT:2,ALBUMIN:2 in the last 72 hours CBC:  Basename 02/04/11 0540 02/03/11 0550  WBC 10.4 7.4  NEUTROABS -- --  HGB 9.0* 7.6*  HCT 27.5* 24.0*  MCV 95.2 95.6  PLT 257 214   Cardiac Enzymes:  Basename 02/04/11 0110 02/03/11 1629 02/03/11 0900  CKTOTAL 64 73 80  CKMB 5.3* 6.3* 6.7*  CKMBINDEX -- -- --  TROPONINI 1.23* 1.30* 1.71*   BNP:  Basename 02/05/11 0553  POCBNP 20762.0*   CBG:  Basename 02/05/11 1130 02/05/11 0700 02/05/11 0615 02/04/11 2149 02/04/11 2106 02/04/11 1606  GLUCAP 233* 127* 67* 290* 353* 290*   Coagulation:  Basename 02/04/11 0540 02/03/11 0550  LABPROT 25.6* 26.8*  INR 2.29* 2.43*     Medications: Scheduled Meds:    . furosemide  40 mg Intravenous BID  . insulin aspart  0-15 Units Subcutaneous TID WC  . insulin aspart  0-5 Units Subcutaneous QHS  . insulin glargine  15 Units  Subcutaneous QHS  . isosorbide-hydrALAZINE  1 tablet Oral TID  . levothyroxine  125 mcg Oral Daily  . lipase/protease/amylase  1 capsule Oral TID WC  . loratadine  10 mg Oral Daily  . nebivolol  5 mg Oral Daily  . nystatin  5 mL Oral QID  . oxyCODONE  10 mg Oral BID  . pantoprazole  80 mg Oral Daily  . sodium chloride  3 mL Intravenous Q12H  . warfarin  1 mg Oral Custom  . warfarin  2 mg Oral Custom  . DISCONTD: insulin glargine  10 Units Subcutaneous QHS  . DISCONTD: nebivolol  5 mg Oral Daily  . 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

## 2011-02-06 ENCOUNTER — Inpatient Hospital Stay (HOSPITAL_COMMUNITY): Payer: Medicare Other

## 2011-02-06 LAB — BASIC METABOLIC PANEL
BUN: 95 mg/dL — ABNORMAL HIGH (ref 6–23)
Calcium: 7.6 mg/dL — ABNORMAL LOW (ref 8.4–10.5)
Creatinine, Ser: 2.99 mg/dL — ABNORMAL HIGH (ref 0.50–1.35)
GFR calc Af Amer: 20 mL/min — ABNORMAL LOW (ref >90)
GFR calc non Af Amer: 18 mL/min — ABNORMAL LOW (ref >90)

## 2011-02-06 LAB — GLUCOSE, CAPILLARY: Glucose-Capillary: 241 mg/dL — ABNORMAL HIGH (ref 70–99)

## 2011-02-06 LAB — PROTIME-INR: INR: 2.62 — ABNORMAL HIGH (ref 0.00–1.49)

## 2011-02-06 MED ORDER — FUROSEMIDE 10 MG/ML IJ SOLN: 80.0000 mg | Freq: Three times a day (TID) | INTRAMUSCULAR | Status: DC

## 2011-02-06 MED ORDER — FUROSEMIDE 10 MG/ML IJ SOLN: 80.0000 mg | Freq: Once | INTRAMUSCULAR | Status: DC

## 2011-02-06 MED ORDER — FUROSEMIDE 10 MG/ML IJ SOLN
80.0000 mg | Freq: Once | INTRAMUSCULAR | Status: AC
  Administered 2011-02-06: 80 mg via INTRAVENOUS

## 2011-02-06 MED ORDER — FUROSEMIDE 10 MG/ML IJ SOLN
80.0000 mg | Freq: Three times a day (TID) | INTRAMUSCULAR | Status: DC
  Administered 2011-02-06 – 2011-02-07 (×2): 80 mg via INTRAVENOUS
  Filled 2011-02-06 (×4): qty 8

## 2011-02-06 NOTE — Progress Notes (Signed)
Utilization review complete 

## 2011-02-06 NOTE — Progress Notes (Addendum)
Patient Name: William French Date of Encounter: 02/06/2011    SUBJECTIVE:Breathing about the same as on admission. He denies angina. There are no palpitations.  TELEMETRY:  NSR and occasional PVC., and one NSVT episode. Filed Vitals:   02/05/11 1351 02/05/11 1415 02/05/11 2300 02/06/11 0500  BP: 79/35 105/43 141/53 124/56  Pulse: 64  65 65  Temp: 97.6 F (36.4 C)  98 F (36.7 C) 98.2 F (36.8 C)  TempSrc: Oral  Oral Oral  Resp: 18  14 15   Height:      Weight:    52 kg (114 lb 10.2 oz)  SpO2: 93%  93% 95%    Intake/Output Summary (Last 24 hours) at 02/06/11 0809 Last data filed at 02/05/11 2339  Gross per 24 hour  Intake    543 ml  Output    375 ml  Net    168 ml    LABS: Basic Metabolic Panel:  Basename 02/05/11 0553 02/04/11 0540  NA 142 143  K 4.0 4.0  CL 110 111  CO2 21 19  GLUCOSE 72 140*  BUN 94* 94*  CREATININE 3.00* 2.90*  CALCIUM 7.7* 7.8*  MG -- --  PHOS -- --   CBC:  Basename 02/04/11 0540  WBC 10.4  NEUTROABS --  HGB 9.0*  HCT 27.5*  MCV 95.2  PLT 257   Cardiac Enzymes:  Basename 02/04/11 0110 02/03/11 1629 02/03/11 0900  CKTOTAL 64 73 80  CKMB 5.3* 6.3* 6.7*  CKMBINDEX -- -- --  TROPONINI 1.23* 1.30* 1.71*   BNP:  Basename 02/05/11 0553  POCBNP 20762.0*    Radiology/Studies:  No recent new studies  RADIOLOGY REPORT*  Clinical Data: CHF and shortness of breath.  PORTABLE CHEST - 1 VIEW  Comparison: 03/28/2010  Findings: Moderate pulmonary edema present with probable component of right pleural fluid. Stable cardiomegaly.  IMPRESSION: Moderate pulmonary edema and probable right pleural effusion.  Original Report Authenticated By: Reola Calkins, M.D.      Physical Exam: Blood pressure 124/56, pulse 65, temperature 98.2 F (36.8 C), temperature source Oral, resp. rate 15, height 5\' 8"  (1.727 m), weight 52 kg (114 lb 10.2 oz), SpO2 95.00%. Weight change: 0.29 kg (10.2 oz)   Rales at the bases. Irregular rhythm.  S4 gallop. Right AKA. No left leg edema,.  ASSESSMENT:  1. Class 4 ? diastolic heart failure, but I am unable to find an EF. 2. ESRD 3. DM 4. NSTEMI   Plan:  1. More aggressive diuresis as tolerated by BP 2. Determine if he would accept dialysis/is a candidate. 3. Consider renal consult 4. Consider hospice and palliative care consult.  Windy Fast W 02/06/2011, 8:09 AM

## 2011-02-06 NOTE — Progress Notes (Signed)
ANTICOAGULATION CONSULT NOTE - Follow Up Consult  Pharmacy Consult for Coumadin Indication: PAF and h/o PE  Allergies  Allergen Reactions  . Diltiazem Anaphylaxis  . Lisinopril     unkwown reaction  . Ampicillin Itching and Rash    Tolerates Rocephin  . Sulfa Antibiotics Rash    Patient Measurements: Height: 5\' 8"  (172.7 cm) Weight: 114 lb 10.2 oz (52 kg) IBW/kg (Calculated) : 68.4    Vital Signs: Temp: 98.2 F (36.8 C) (11/30 0500) Temp src: Oral (11/30 0500) BP: 124/56 mmHg (11/30 0500) Pulse Rate: 65  (11/30 0500)  Labs:  Basename 02/06/11 0520 02/05/11 0553 02/04/11 0540 02/04/11 0110 02/03/11 1629  HGB -- -- 9.0* -- --  HCT -- -- 27.5* -- --  PLT -- -- 257 -- --  APTT -- -- -- -- --  LABPROT 28.4* -- 25.6* -- --  INR 2.62* -- 2.29* -- --  HEPARINUNFRC -- -- -- -- --  CREATININE -- 3.00* 2.90* -- --  CKTOTAL -- -- -- 64 73  CKMB -- -- -- 5.3* 6.3*  TROPONINI -- -- -- 1.23* 1.30*   Estimated Creatinine Clearance: 13.2 ml/min (by C-G formula based on Cr of 3).   Medications:  Scheduled:    . furosemide  80 mg Intravenous Q8H  . furosemide  80 mg Intramuscular Once  . insulin aspart  0-15 Units Subcutaneous TID WC  . insulin aspart  0-5 Units Subcutaneous QHS  . insulin glargine  15 Units Subcutaneous QHS  . isosorbide-hydrALAZINE  1 tablet Oral TID  . levothyroxine  125 mcg Oral Daily  . lipase/protease/amylase  1 capsule Oral TID WC  . loratadine  10 mg Oral Daily  . nebivolol  5 mg Oral Daily  . nystatin  5 mL Oral QID  . oxyCODONE  10 mg Oral BID  . pantoprazole  80 mg Oral Daily  . sodium chloride  3 mL Intravenous Q12H  . warfarin  1 mg Oral Custom  . warfarin  2 mg Oral Custom  . DISCONTD: furosemide  40 mg Intravenous BID  . DISCONTD: furosemide  80 mg Intravenous TID   Goal of Therapy:  INR 2-3   Assessment/Plan:  77yo male on home dose of Coumadin 1mg  daily, except 2mg  on MonTuesWed.  INR therapeutic at 2.62 with no problems noted.   Will continue current dosing and check INR MWF only.  Kitt Ledet P 02/06/2011,10:56 AM

## 2011-02-06 NOTE — Consult Note (Signed)
[facility] Renal Consultation Note  Requesting MD: Dr. Lavera Guise Indication for Consultation:  Advanced chronic kidney disease and not a dialysis candidate  HPI: William French is a 77 y.o. male with past medical history significant for diabetes mellitus, congestive heart failure, hypertension, atrial fibrillation, some type of spinal cord injury that has led to lower extremity paralysis, decubitus ulcers and very debilitated state. Currently at home with providers coming to his home to take care of him. Also of note, he's had 8 hospitalizations in the past year according to the wife mostly due to aspiration pneumonia. Patient had been seen by Dr. Elvis Coil at [facility] in the past last seen about a year ago. He stopped coming to see Dr. Hyman Hopes because it was feasibly difficult for him to get to our office. Dr. Hyman Hopes had discussed with the family that the patient was not a chronic dialysis candidate.   Patient now is an inpatient at [facility] with main complaint of shortness of breath. This is felt to be multifactorial secondary to heart failure, atrial fibrillation, history of pulmonary embolism. They're concerned to that he is not diuresis and well with medication.  He only had 375 cc of urine output recorded for the whole day yesterday.  His creatinine is close to 3 with a BUN in the 90s. Of note, last year his creatinine was in the 2.5-2.7 range. We are asked to provide an opinion of whether the patient is or would be a chronic dialysis candidate. I discussed this with the patient and wife and they remember conversations with Dr. Hyman Hopes and they are not interested in pursuing chronic dialysis therapy. In addition, I would say that he is not a good dialysis candidate secondary to his comorbidities and his homebound status.   PMHx:   Past Medical History  Diagnosis Date  . Diabetes mellitus   . CHF (congestive heart failure)   . Hypertension   . Dysrhythmia      Past Surgical History  Procedure Date  . Leg amputation above knee   . Coronary stent placement     Family Hx: History reviewed. No pertinent family history.  Social History:  reports that he has quit smoking. He does not have any smokeless tobacco history on file. He reports that he does not drink alcohol or use illicit drugs.  Allergies:  Allergies  Allergen Reactions  . Diltiazem Anaphylaxis  . Lisinopril     unkwown reaction  . Ampicillin Itching and Rash    Tolerates Rocephin  . Sulfa Antibiotics Rash    Medications: Prior to Admission medications   Medication Sig Start Date End Date Taking? Authorizing Provider  Amylase-Lipase-Protease (PANCREASE MT 10 PO) Take 10 mg by mouth daily.     Yes Historical Provider, MD  cetirizine (ZYRTEC) 10 MG tablet Take 10 mg by mouth daily.     Yes Historical Provider, MD  furosemide (LASIX) 40 MG tablet Take 40 mg by mouth 2 (two) times daily.     Yes Historical Provider, MD  hydrALAZINE (APRESOLINE) 25 MG tablet Take 25 mg by mouth 3 (three) times daily.     Yes Historical Provider, MD  HYDROcodone-acetaminophen (NORCO) 5-325 MG per tablet Take 1 tablet by mouth every 6 (six) hours as needed. For pain    Yes Historical Provider, MD  insulin glargine (LANTUS SOLOSTAR) 100 UNIT/ML injection Inject 8 Units into the skin at bedtime.     Yes Historical Provider, MD  insulin regular (HUMULIN  R,NOVOLIN R) 100 units/mL injection Inject 1-2 Units into the skin 3 (three) times daily before meals. Per sliding scale    Yes Historical Provider, MD  levothyroxine (SYNTHROID, LEVOTHROID) 125 MCG tablet Take 125 mcg by mouth daily.     Yes Historical Provider, MD  nebivolol (BYSTOLIC) 5 MG tablet Take 5 mg by mouth daily.     Yes Historical Provider, MD  nitroGLYCERIN (NITROSTAT) 0.4 MG SL tablet Place 0.4 mg under the tongue every 5 (five) minutes as needed. For chest pain    Yes Historical Provider, MD  oxycodone (OXY-IR) 5 MG capsule Take 5 mg by  mouth every 4 (four) hours as needed. For pain    Yes Historical Provider, MD  oxyCODONE (OXYCONTIN) 10 MG 12 hr tablet Take 10 mg by mouth every 12 (twelve) hours.     Yes Historical Provider, MD  pantoprazole (PROTONIX) 40 MG tablet Take 80 mg by mouth daily.     Yes Historical Provider, MD  warfarin (COUMADIN) 2 MG tablet Take 1-2 mg by mouth daily. As directed by home meter   Yes Historical Provider, MD    I have reviewed the patient's current medications.  Labs:  Results for orders placed during the hospital encounter of 02/01/11 (from the past 48 hour(s))  GLUCOSE, CAPILLARY     Status: Abnormal   Collection Time   02/04/11  4:06 PM      Component Value Range Comment   Glucose-Capillary 290 (*) 70 - 99 (mg/dL)   GLUCOSE, CAPILLARY     Status: Abnormal   Collection Time   02/04/11  9:06 PM      Component Value Range Comment   Glucose-Capillary 353 (*) 70 - 99 (mg/dL)    Comment 1 Notify RN     GLUCOSE, CAPILLARY     Status: Abnormal   Collection Time   02/04/11  9:49 PM      Component Value Range Comment   Glucose-Capillary 290 (*) 70 - 99 (mg/dL)   BASIC METABOLIC PANEL     Status: Abnormal   Collection Time   02/05/11  5:53 AM      Component Value Range Comment   Sodium 142  135 - 145 (mEq/L)    Potassium 4.0  3.5 - 5.1 (mEq/L)    Chloride 110  96 - 112 (mEq/L)    CO2 21  19 - 32 (mEq/L)    Glucose, Bld 72  70 - 99 (mg/dL)    BUN 94 (*) 6 - 23 (mg/dL)    Creatinine, Ser 1.61 (*) 0.50 - 1.35 (mg/dL)    Calcium 7.7 (*) 8.4 - 10.5 (mg/dL)    GFR calc non Af Amer 18 (*) >90 (mL/min)    GFR calc Af Amer 20 (*) >90 (mL/min)   PRO B NATRIURETIC PEPTIDE     Status: Abnormal   Collection Time   02/05/11  5:53 AM      Component Value Range Comment   BNP, POC 20762.0 (*) 0 - 450 (pg/mL)   GLUCOSE, CAPILLARY     Status: Abnormal   Collection Time   02/05/11  6:15 AM      Component Value Range Comment   Glucose-Capillary 67 (*) 70 - 99 (mg/dL)    Comment 1 Notify RN       GLUCOSE, CAPILLARY     Status: Abnormal   Collection Time   02/05/11  7:00 AM      Component Value Range Comment   Glucose-Capillary 127 (*)  70 - 99 (mg/dL)    Comment 1 Notify RN     GLUCOSE, CAPILLARY     Status: Abnormal   Collection Time   02/05/11 11:30 AM      Component Value Range Comment   Glucose-Capillary 233 (*) 70 - 99 (mg/dL)    Comment 1 Notify RN      Comment 2 Documented in Chart     GLUCOSE, CAPILLARY     Status: Abnormal   Collection Time   02/05/11  5:12 PM      Component Value Range Comment   Glucose-Capillary 263 (*) 70 - 99 (mg/dL)    Comment 1 Notify RN     GLUCOSE, CAPILLARY     Status: Normal   Collection Time   02/05/11 10:08 PM      Component Value Range Comment   Glucose-Capillary 72  70 - 99 (mg/dL)   GLUCOSE, CAPILLARY     Status: Abnormal   Collection Time   02/05/11 10:43 PM      Component Value Range Comment   Glucose-Capillary 154 (*) 70 - 99 (mg/dL)   PROTIME-INR     Status: Abnormal   Collection Time   02/06/11  5:20 AM      Component Value Range Comment   Prothrombin Time 28.4 (*) 11.6 - 15.2 (seconds)    INR 2.62 (*) 0.00 - 1.49    GLUCOSE, CAPILLARY     Status: Abnormal   Collection Time   02/06/11  6:13 AM      Component Value Range Comment   Glucose-Capillary 162 (*) 70 - 99 (mg/dL)   BASIC METABOLIC PANEL     Status: Abnormal   Collection Time   02/06/11 10:20 AM      Component Value Range Comment   Sodium 138  135 - 145 (mEq/L)    Potassium 4.8  3.5 - 5.1 (mEq/L)    Chloride 108  96 - 112 (mEq/L)    CO2 20  19 - 32 (mEq/L)    Glucose, Bld 198 (*) 70 - 99 (mg/dL)    BUN 95 (*) 6 - 23 (mg/dL)    Creatinine, Ser 0.45 (*) 0.50 - 1.35 (mg/dL)    Calcium 7.6 (*) 8.4 - 10.5 (mg/dL)    GFR calc non Af Amer 18 (*) >90 (mL/min)    GFR calc Af Amer 20 (*) >90 (mL/min)   GLUCOSE, CAPILLARY     Status: Abnormal   Collection Time   02/06/11 11:28 AM      Component Value Range Comment   Glucose-Capillary 198 (*) 70 - 99 (mg/dL)     Comment 1 Documented in Chart      Comment 2 Notify RN        ROS: Fortunately, patient states that he feels pretty well. He is short of breath without cough. He denies orthopnea or P. M.D. He's not having any chest pain at the moment. He denies abdominal pain, nausea, vomiting, diarrhea, constipation. The thing that's bothering him the most is his throat. He is currently been changed to a thickened diet which he does not like. However it is necessary due to his frequent aspirations. In addition, he denies fevers, chills, night sweats. He does not have any lower extremity edema or any sacral edema. The remainder of the review systems is negative.    Physical Exam: Filed Vitals:   02/06/11 1400  BP: 141/60  Pulse: 74  Temp: 97.7 F (36.5 C)  Resp: 22  General: Patient is elderly appearing, he is alert and oriented and in no acute distress. He is very capable of entering into these conversations. Wife is at bedside HEENT: Pupils are equal round and reactive to light, extraocular motions are intact and mucous membranes are moist Neck: There is no jugular venous distention Heart: Irregularly irregular without murmur Lungs: Coarse breath sounds bilaterally also with wheezes Abdomen: Soft, nontender, nondistended. There is no hepatosplenomegaly. Extremities: Absolutely no edema at this time Skin: He has a decubitus ulcer Neuro:  Assessment/Plan: 1.Renal- patient with advanced chronic kidney disease which is actually not much worse than his baseline. Patient and wife as well as the nephrology team agree that he is not a good candidate for chronic dialysis therapy secondary to his comorbid issues and is homebound status. It may be best to transition his care to palliative at this time. Patient actually stated to me that he is "ready to go" .   He actually does not appear to have that much volume on to see you could consider decreasing his Lasix some.  I would simply focus in on comfort at  this point. Patient and wife are in agreement with this strategy. I don't believe that we will have much to offer so we will not see him after today. Please call if you have any additional questions.     Raylene Carmickle A 02/06/2011, 3:35 PM

## 2011-02-06 NOTE — Progress Notes (Signed)
Subjective: Feels better today - decrease mouth and sacral pain. Dyspnea still present   Objective: Weight change: 0.29 kg (10.2 oz)  Intake/Output Summary (Last 24 hours) at 02/06/11 1318 Last data filed at 02/05/11 2339  Gross per 24 hour  Intake    423 ml  Output     75 ml  Net    348 ml   BP 124/56  Pulse 65  Temp(Src) 98.2 F (36.8 C) (Oral)  Resp 15  Ht 5\' 8"  (1.727 m)  Wt 52 kg (114 lb 10.2 oz)  BMI 17.43 kg/m2  SpO2 95% General: Alert and oriented x3, no apparent distress, appears stated age Cardiovascular: Irregular rhythm, rate controlled, Lungs: patient with paradoxical breathing and rhonchi Abdomen: Soft, nontender, nondistended, hypoactive bowel sounds Extremities: Trace pitting edema, emaciated  patient is paralyzed.  Lab Results: Basic Metabolic Panel:  Basename 02/06/11 1020 02/05/11 0553  NA 138 142  K 4.8 4.0  CL 108 110  CO2 20 21  GLUCOSE 198* 72  BUN 95* 94*  CREATININE 2.99* 3.00*  CALCIUM 7.6* 7.7*  MG -- --  PHOS -- --   Liver Function Tests: No results found for this basename: AST:2,ALT:2,ALKPHOS:2,BILITOT:2,PROT:2,ALBUMIN:2 in the last 72 hours CBC:  Basename 02/04/11 0540  WBC 10.4  NEUTROABS --  HGB 9.0*  HCT 27.5*  MCV 95.2  PLT 257   Cardiac Enzymes:  Basename 02/04/11 0110 02/03/11 1629  CKTOTAL 64 73  CKMB 5.3* 6.3*  CKMBINDEX -- --  TROPONINI 1.23* 1.30*   BNP:  Basename 02/05/11 0553  POCBNP 20762.0*   CBG:  Basename 02/06/11 1128 02/06/11 0613 02/05/11 2243 02/05/11 2208 02/05/11 1712 02/05/11 1130  GLUCAP 198* 162* 154* 72 263* 233*   Coagulation:  Basename 02/06/11 0520 02/04/11 0540  LABPROT 28.4* 25.6*  INR 2.62* 2.29*     Medications: Scheduled Meds:    . furosemide  80 mg Intravenous Q8H  . furosemide  80 mg Intravenous Once  . insulin aspart  0-15 Units Subcutaneous TID WC  . insulin aspart  0-5 Units Subcutaneous QHS  . insulin glargine  15 Units Subcutaneous QHS  .  isosorbide-hydrALAZINE  1 tablet Oral TID  . levothyroxine  125 mcg Oral Daily  . lipase/protease/amylase  1 capsule Oral TID WC  . loratadine  10 mg Oral Daily  . nebivolol  5 mg Oral Daily  . nystatin  5 mL Oral QID  . oxyCODONE  10 mg Oral BID  . pantoprazole  80 mg Oral Daily  . sodium chloride  3 mL Intravenous Q12H  . warfarin  1 mg Oral Custom  . warfarin  2 mg Oral Custom  . DISCONTD: furosemide  40 mg Intravenous BID  . DISCONTD: furosemide  80 mg Intravenous TID  . 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

## 2011-02-06 NOTE — Plan of Care (Signed)
Problem: Phase I Progression Outcomes Goal: Voiding-avoid urinary catheter unless indicated Outcome: Completed/Met Date Met:  02/06/11 Urostomy in place

## 2011-02-07 LAB — GLUCOSE, CAPILLARY
Glucose-Capillary: 134 mg/dL — ABNORMAL HIGH (ref 70–99)
Glucose-Capillary: 47 mg/dL — ABNORMAL LOW (ref 70–99)
Glucose-Capillary: 104 mg/dL — ABNORMAL HIGH (ref 70–99)

## 2011-02-07 LAB — BASIC METABOLIC PANEL
BUN: 99 mg/dL — ABNORMAL HIGH (ref 6–23)
CO2: 22 mEq/L (ref 19–32)
Chloride: 109 mEq/L (ref 96–112)
Creatinine, Ser: 3.23 mg/dL — ABNORMAL HIGH (ref 0.50–1.35)

## 2011-02-07 MED ORDER — FUROSEMIDE 80 MG PO TABS
80.0000 mg | ORAL_TABLET | Freq: Every day | ORAL | Status: DC
  Administered 2011-02-07: 80 mg via ORAL
  Filled 2011-02-07: qty 1

## 2011-02-07 MED ORDER — FUROSEMIDE 80 MG PO TABS
80.0000 mg | ORAL_TABLET | Freq: Two times a day (BID) | ORAL | Status: DC
  Administered 2011-02-07 – 2011-02-09 (×5): 80 mg via ORAL
  Filled 2011-02-07 (×6): qty 1

## 2011-02-07 NOTE — Progress Notes (Signed)
Subjective: Just had goals of care meeting and he has chosen comfort care only and home hospice.   Objective: Weight change: 2 kg (4 lb 6.5 oz)  Intake/Output Summary (Last 24 hours) at 02/07/11 1215 Last data filed at 02/07/11 0930  Gross per 24 hour  Intake    240 ml  Output   1602 ml  Net  -1362 ml   BP 123/46  Pulse 74  Temp(Src) 98 F (36.7 C) (Oral)  Resp 24  Ht 5\' 8"  (1.727 m)  Wt 54 kg (119 lb 0.8 oz)  BMI 18.10 kg/m2  SpO2 95% General: Alert and oriented x3, no apparent distress, appears stated age Dry mouth, dentures, tongue depapilated Cardiovascular: Irregular rhythm, rate controlled, Lungs: patient with paradoxical breathing and rhonchi Abdomen: Soft, nontender, nondistended, hypoactive bowel sounds Extremities: Trace pitting edema, emaciated  patient is paralyzed.  Lab Results: Basic Metabolic Panel:  Basename 02/07/11 0500 02/06/11 1020  NA 142 138  K 4.4 4.8  CL 109 108  CO2 22 20  GLUCOSE 101* 198*  BUN 99* 95*  CREATININE 3.23* 2.99*  CALCIUM 7.6* 7.6*  MG -- --  PHOS -- --   BNP:  Basename 02/05/11 0553  POCBNP 20762.0*   CBG:  Basename 02/07/11 1114 02/07/11 0557 02/06/11 2135 02/06/11 1625 02/06/11 1128 02/06/11 0613  GLUCAP 330* 70 241* 151* 198* 162*   Coagulation:  Basename 02/06/11 0520  LABPROT 28.4*  INR 2.62*     Medications: Scheduled Meds:    . furosemide  80 mg Oral BID  . insulin aspart  0-15 Units Subcutaneous TID WC  . insulin aspart  0-5 Units Subcutaneous QHS  . insulin glargine  15 Units Subcutaneous QHS  . isosorbide-hydrALAZINE  1 tablet Oral TID  . levothyroxine  125 mcg Oral Daily  . lipase/protease/amylase  1 capsule Oral TID WC  . loratadine  10 mg Oral Daily  . nebivolol  5 mg Oral Daily  . nystatin  5 mL Oral QID  . oxyCODONE  10 mg Oral BID  . pantoprazole  80 mg Oral Daily  . sodium chloride  3 mL Intravenous Q12H  . warfarin  1 mg Oral Custom  . warfarin  2 mg Oral Custom  . DISCONTD:  furosemide  80 mg Intravenous Q8H  . 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

## 2011-02-07 NOTE — Progress Notes (Addendum)
Subjective:  C/o dysphagia.  Still wheezing, but not SOB.  No chest pain  Objective:  Vital Signs in the last 24 hours: BP 123/46  Pulse 74  Temp(Src) 98 F (36.7 C) (Oral)  Resp 24  Ht 5\' 8"  (1.727 m)  Wt 54 kg (119 lb 0.8 oz)  BMI 18.10 kg/m2  SpO2 95%  Physical Exam: Elderly WM in NAD, appears week Lungs:  Bilateral exp wheezing Cardiac:  iregular rhythm, normal S1 and S2, no S3 Abdomen:  Soft, nontender, no masses Extremities: no dema left leg  Intake/Output from previous day: 11/30 0701 - 12/01 0700 In: -  Out: 1602 [Urine:1600; Stool:2]  Lab Results: Basic Metabolic Panel:  Basename 02/07/11 0500 02/06/11 1020  NA 142 138  K 4.4 4.8  CL 109 108  CO2 22 20  GLUCOSE 101* 198*  BUN 99* 95*  CREATININE 3.23* 2.99*   PROTIME: Lab Results  Component Value Date   INR 2.62* 02/06/2011   INR 2.29* 02/04/2011   INR 2.43* 02/03/2011    Telemetry: Reviewed :  Sinus with PAC's  Assessment/Plan:  1. Acute diastolic heart failure 2. Stage IV chronic kidney disease worsening 3. DM   Recommendations:  Not good dialysis candidate and turned down.  Worsening renal function with diuresis and will need to watch. Check BNP am and and increase Lasix to BID    W. Ashley Royalty.  MD [facility] 02/07/2011, 10:27 AM

## 2011-02-07 NOTE — Consult Note (Signed)
Consult was requested by: Dr Lavera Guise   Consult is for review of medical treatment options, clarification of goals of care and end of life issues, disposition and options, and symptom recommendation.  This NP Lorinda Creed reviewed medical records, received report from team, assessed the patient and then meet at the patient's bedside along with his wife William French # [phone]  to discuss diagnosis prognosis, GOC, EOL wishes disposition and options.   Problem List: 1. CKD- will not consider dialysis and not considered a    good    candidate at this time 2. DM 3. CHF 4. Nstemi 5. H/o cervical spine injury, many yrs ago 6. Bed bound  /PPS 30 %    -sacral decubitis, poor po intake, decreasing function,    -weakness, dysphagia, adult failure to thrive    Patient Goals/ Recommendations: 1. DNR/DNI 2. No artificial feeding or hydration now or in the future     -comfort feeds and fluids of choice 3. Comfort is main focus of care     -dc home with hospice services, minimize medial     interventions, symptoms management, no           rehospitalization (will write for choice)      -Hopeful for dc on Monday  A detailed discussion was had today regarding advanced directives.  Concepts specific to code status, artifical feeding and hydration, continued IV antibiotics and rehospitalization was had.  The difference between a aggressive medical intervention path  and a palliative comfort care path for this patient at this time was had.  Values and goals of care important to patient and family were attempted to be elicited.  Concept of Hospice and Palliative Care were discussed   Anticipatory care needs discussed Natural trajectory and expectations at EOL were discussed.  Questions and concerns addressed.  Hard Choices booklet left for review. Family encouraged to call with questions or concerns.  PMT will continue to support holistically.  Dr. Lavera Guise aware of above   Total time spent on the unit was  60 minutes.  Time in 0930- time out 1030. Greater than 50% of the time was spent in counseling and coordination of care.      Lorinda Creed NP

## 2011-02-08 LAB — GLUCOSE, CAPILLARY
Glucose-Capillary: 142 mg/dL — ABNORMAL HIGH (ref 70–99)
Glucose-Capillary: 398 mg/dL — ABNORMAL HIGH (ref 70–99)
Glucose-Capillary: 125 mg/dL — ABNORMAL HIGH (ref 70–99)

## 2011-02-08 LAB — BASIC METABOLIC PANEL
CO2: 22 mEq/L (ref 19–32)
Calcium: 7.4 mg/dL — ABNORMAL LOW (ref 8.4–10.5)
GFR calc non Af Amer: 15 mL/min — ABNORMAL LOW (ref >90)
Glucose, Bld: 118 mg/dL — ABNORMAL HIGH (ref 70–99)
Potassium: 4.5 mEq/L (ref 3.5–5.1)
Sodium: 134 mEq/L — ABNORMAL LOW (ref 135–145)

## 2011-02-08 MED ORDER — BENZONATATE 100 MG PO CAPS
200.0000 mg | ORAL_CAPSULE | Freq: Three times a day (TID) | ORAL | Status: DC | PRN
  Filled 2011-02-08: qty 2

## 2011-02-08 NOTE — Progress Notes (Signed)
Subjective: Has no new concerns   Objective: Weight change: -6.418 kg (-14 lb 2.4 oz)  Intake/Output Summary (Last 24 hours) at 02/08/11 1746 Last data filed at 02/08/11 1200  Gross per 24 hour  Intake    720 ml  Output    750 ml  Net    -30 ml   BP 121/45  Pulse 65  Temp(Src) 98.7 F (37.1 C) (Oral)  Resp 20  Ht 5\' 8"  (1.727 m)  Wt 47.582 kg (104 lb 14.4 oz)  BMI 15.95 kg/m2  SpO2 96% General: Alert and oriented x3, no apparent distress, appears stated age Dry mouth, dentures, tongue depapilated Cardiovascular: Irregular rhythm, rate controlled, Lungs: patient with paradoxical breathing and rhonchi Abdomen: Soft, nontender, nondistended, hypoactive bowel sounds Extremities: Trace pitting edema, emaciated  patient is paralyzed.  Lab Results: Basic Metabolic Panel:  Basename 02/08/11 0630 02/07/11 0500  NA 134* 142  K 4.5 4.4  CL 103 109  CO2 22 22  GLUCOSE 118* 101*  BUN 98* 99*  CREATININE 3.43* 3.23*  CALCIUM 7.4* 7.6*  MG -- --  PHOS -- --   BNP: No results found for this basename: POCBNP:3 in the last 72 hours CBG:  Basename 02/08/11 1616 02/08/11 1151 02/08/11 0627 02/08/11 0626 02/08/11 0150 02/07/11 2138  GLUCAP 125* 398* 129* 492* 142* 104*   Coagulation:  Basename 02/06/11 0520  LABPROT 28.4*  INR 2.62*     Medications: Scheduled Meds:    . furosemide  80 mg Oral BID  . insulin aspart  0-15 Units Subcutaneous TID WC  . insulin aspart  0-5 Units Subcutaneous QHS  . insulin glargine  15 Units Subcutaneous QHS  . isosorbide-hydrALAZINE  1 tablet Oral TID  . levothyroxine  125 mcg Oral Daily  . lipase/protease/amylase  1 capsule Oral TID WC  . loratadine  10 mg Oral Daily  . nebivolol  5 mg Oral Daily  . nystatin  5 mL Oral QID  . oxyCODONE  10 mg Oral BID  . pantoprazole  80 mg Oral Daily  . sodium chloride  3 mL Intravenous Q12H  . warfarin  1 mg Oral Custom  . 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

## 2011-02-08 NOTE — Progress Notes (Addendum)
Subjective:  Feels better today with no SOB. Has decided for palliative care. No chest pain  Objective:  Vital Signs in the last 24 hours: BP 143/51  Pulse 67  Temp(Src) 99.4 F (37.4 C) (Oral)  Resp 20  Ht 5\' 8"  (1.727 m)  Wt 47.582 kg (104 lb 14.4 oz)  BMI 15.95 kg/m2  SpO2 94%  Physical Exam: Elderly WM in NAD, appears no acute distress Lungs:  CLear today Cardiac:  iregular rhythm, normal S1 and S2, no S3 Abdomen:  Soft, nontender, no masses Extremities: no edema left leg  Intake/Output from previous day: 12/01 0701 - 12/02 0700 In: 480 [P.O.:480] Out: 1750 [Urine:1750]  Lab Results: Basic Metabolic Panel:  Basename 02/08/11 0630 02/07/11 0500  NA 134* 142  K 4.5 4.4  CL 103 109  CO2 22 22  GLUCOSE 118* 101*  BUN 98* 99*  CREATININE 3.43* 3.23*   PROTIME: Lab Results  Component Value Date   INR 2.62* 02/06/2011   INR 2.29* 02/04/2011   INR 2.43* 02/03/2011    Telemetry: Reviewed :  Sinus with PAC's  Assessment/Plan:  1. Acute on chronicdiastolic heart failure 2. Stage IV chronic kidney disease worsening 3. DM   Recommendations:  Palliative care in place.  Worsening renal function with diuresis and will need to watch with increase diuresis.  William French.  MD [facility] 02/08/2011, 12:16 PM

## 2011-02-09 LAB — BASIC METABOLIC PANEL
BUN: 101 mg/dL — ABNORMAL HIGH (ref 6–23)
CO2: 23 mEq/L (ref 19–32)
Calcium: 7.4 mg/dL — ABNORMAL LOW (ref 8.4–10.5)
Chloride: 102 mEq/L (ref 96–112)
Creatinine, Ser: 3.38 mg/dL — ABNORMAL HIGH (ref 0.50–1.35)

## 2011-02-09 LAB — GLUCOSE, CAPILLARY: Glucose-Capillary: 233 mg/dL — ABNORMAL HIGH (ref 70–99)

## 2011-02-09 MED ORDER — NYSTATIN 100000 UNIT/ML MT SUSP: 5.0000 mL | Freq: Four times a day (QID) | OROMUCOSAL | Status: AC

## 2011-02-09 MED ORDER — WARFARIN SODIUM 2 MG PO TABS: 2.0000 mg | ORAL_TABLET | ORAL | Status: DC

## 2011-02-09 MED ORDER — ISOSORB DINITRATE-HYDRALAZINE 20-37.5 MG PO TABS: 1.0000 | ORAL_TABLET | Freq: Three times a day (TID) | ORAL | Status: AC

## 2011-02-09 MED ORDER — FUROSEMIDE 40 MG PO TABS: 80.0000 mg | ORAL_TABLET | Freq: Two times a day (BID) | ORAL | Status: AC

## 2011-02-09 MED ORDER — INSULIN GLARGINE 100 UNIT/ML ~~LOC~~ SOLN: 10.0000 [IU] | Freq: Every day | SUBCUTANEOUS | Status: AC

## 2011-02-09 MED ORDER — WARFARIN SODIUM 2.5 MG PO TABS
2.5000 mg | ORAL_TABLET | Freq: Once | ORAL | Status: DC
  Filled 2011-02-09: qty 1

## 2011-02-09 NOTE — Progress Notes (Signed)
Progress Note from the Palliative Medicine Team at [facility]  Subjective: Patient and wife happy about discharge home  Patient "feels good"     Objective: Allergies  Allergen Reactions  . Diltiazem Anaphylaxis  . Lisinopril     unkwown reaction  . Ampicillin Itching and Rash    Tolerates Rocephin  . Sulfa Antibiotics Rash   Scheduled Meds:   . furosemide  80 mg Oral BID  . insulin aspart  0-15 Units Subcutaneous TID WC  . insulin aspart  0-5 Units Subcutaneous QHS  . insulin glargine  15 Units Subcutaneous QHS  . isosorbide-hydrALAZINE  1 tablet Oral TID  . levothyroxine  125 mcg Oral Daily  . lipase/protease/amylase  1 capsule Oral TID WC  . loratadine  10 mg Oral Daily  . nebivolol  5 mg Oral Daily  . nystatin  5 mL Oral QID  . oxyCODONE  10 mg Oral BID  . pantoprazole  80 mg Oral Daily  . sodium chloride  3 mL Intravenous Q12H  . warfarin  1 mg Oral Custom  . warfarin  2 mg Oral Custom  . warfarin  2.5 mg Oral ONCE-1800  . DISCONTD: warfarin  2 mg Oral Custom   Continuous Infusions:  PRN Meds:.acetaminophen, acetaminophen, benzonatate, food thickener, food thickener, HYDROcodone-acetaminophen, ondansetron (ZOFRAN) IV, ondansetron  BP 136/50  Pulse 71  Temp(Src) 98.6 F (37 C) (Oral)  Resp 18  Ht 5\' 8"  (1.727 m)  Wt 104 lb 15 oz (47.6 kg)  BMI 15.96 kg/m2  SpO2 94%   PPS:30%  Pain Score:denies Pain Location   Intake/Output Summary (Last 24 hours) at 02/09/11 1235 Last data filed at 02/09/11 0900  Gross per 24 hour  Intake    120 ml  Output   1550 ml  Net  -1430 ml      LBM:02-08-11    Stool Softner:none  Physical Exam:  General: alert and oriented,  HEENT:   Chest:   CTA CVS: RRR Abdomen:soft NT +BS Ext: L above the knee amputation Neuro:alert and oriented  Labs: CBC    Component Value Date/Time   WBC 10.4 02/04/2011 0540   RBC 2.89* 02/04/2011 0540   HGB 9.0* 02/04/2011 0540   HCT 27.5* 02/04/2011 0540   PLT 257 02/04/2011 0540   MCV 95.2 02/04/2011 0540   MCH 31.1 02/04/2011 0540   MCHC 32.7 02/04/2011 0540   RDW 14.7 02/04/2011 0540   LYMPHSABS 3.0 02/01/2011 2253   MONOABS 1.3* 02/01/2011 2253   EOSABS 0.1 02/01/2011 2253   BASOSABS 0.2* 02/01/2011 2253    BMET    Component Value Date/Time   NA 137 02/09/2011 0545   K 4.3 02/09/2011 0545   CL 102 02/09/2011 0545   CO2 23 02/09/2011 0545   GLUCOSE 188* 02/09/2011 0545   BUN 101* 02/09/2011 0545   CREATININE 3.38* 02/09/2011 0545   CALCIUM 7.4* 02/09/2011 0545   GFRNONAA 15* 02/09/2011 0545   GFRAA 18* 02/09/2011 0545    CMP     Component Value Date/Time   NA 137 02/09/2011 0545   K 4.3 02/09/2011 0545   CL 102 02/09/2011 0545   CO2 23 02/09/2011 0545   GLUCOSE 188* 02/09/2011 0545   BUN 101* 02/09/2011 0545   CREATININE 3.38* 02/09/2011 0545   CALCIUM 7.4* 02/09/2011 0545   PROT 6.5 02/02/2011 1042   ALBUMIN 2.3* 02/02/2011 1042   AST 19 02/02/2011 1042   ALT 7 02/02/2011 1042   ALKPHOS 72 02/02/2011 1042  BILITOT 0.2* 02/02/2011 1042   GFRNONAA 15* 02/09/2011 0545   GFRAA 18* 02/09/2011 0545    Chest Xray Reviewed/Impressions:  CT scan of the Head Reviewed/Impressions:  Anticipatory care needs discussed.  Questions and concerns addressed    Assessment and Plan: 1. Code Status:DNR/DNI 2. Symptom Control:managed presetnly 3. Psycho/Social: 4. Spiritual 5. Disposition:plan to dc home with hospice services of [place]  Patient Documents Completed or Given: Document Given Completed  Advanced Directives Pkt    MOST    DNR    Gone from My Sight    Hard Choices      Time In Time Out Total Time Spent with Patient Total Overall Time         Greater than 50%  of this time was spent counseling and coordinating care related to the above assessment and plan.  Lorinda Creed NP    [phone]   1

## 2011-02-09 NOTE — Progress Notes (Signed)
Pt discharged in stable condition home with all belongings and family via [employer] [facility]. Medications and discharge instructions reviewed prior to discharge.

## 2011-02-09 NOTE — Discharge Summary (Signed)
Physician Discharge Summary  Patient ID: William French MRN: [id number] DOB/AGE: Jul 28, 1923 77 y.o. 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 Qty: 90 tablet, Refills: 0    nystatin (MYCOSTATIN) 100000 UNIT/ML suspension Take 5 mLs (500,000 Units total) by mouth 4 (four) times daily. Qty: 240 mL, Refills: 0      CONTINUE these medications which have CHANGED   Details  furosemide (LASIX) 40 MG tablet Take 2 tablets (80 mg total) by mouth 2 (two) times daily. Qty: 120 tablet, Refills: 0    insulin glargine (LANTUS SOLOSTAR) 100 UNIT/ML injection Inject 10 Units into the skin at bedtime. Qty: 10 mL      CONTINUE these medications which have NOT CHANGED   Details  Amylase-Lipase-Protease (PANCREASE MT 10 PO) Take 10 mg by mouth daily.      cetirizine (ZYRTEC) 10 MG tablet Take 10 mg by mouth daily.      HYDROcodone-acetaminophen (NORCO) 5-325 MG per tablet Take 1 tablet by mouth every 6 (six) hours as needed. For pain     insulin regular (HUMULIN R,NOVOLIN R) 100 units/mL injection Inject 1-2 Units into the skin 3 (three) times daily before meals. Per sliding  scale     levothyroxine (SYNTHROID, LEVOTHROID) 125 MCG tablet Take 125 mcg by mouth daily.      nebivolol (BYSTOLIC) 5 MG tablet Take 5 mg by mouth daily.      nitroGLYCERIN (NITROSTAT) 0.4 MG SL tablet Place 0.4 mg under the tongue every 5 (five) minutes as needed. For chest pain     oxyCODONE (OXYCONTIN) 10 MG 12 hr tablet Take 10 mg by mouth every 12 (twelve) hours.      pantoprazole (PROTONIX) 40 MG tablet Take 80 mg by mouth daily.      warfarin (COUMADIN) 2 MG tablet Take 1-2 mg by mouth daily. As directed by home meter      STOP taking these medications     hydrALAZINE (APRESOLINE) 25 MG tablet      oxycodone (OXY-IR) 5 MG capsule         Discharged Condition: Home with hospice in fair condition    Consults: [facility] cardiology and [facility]  Significant Diagnostic Studies: Dg Chest Port 1 View  02/06/2011  *RADIOLOGY REPORT*  Clinical Data: Pulmonary edema. Shortness of breath.  PORTABLE CHEST - 1 VIEW  Comparison: 02/01/2011.  Findings: Asymmetric air space disease greater on the right may represent pulmonary edema with pleural effusions.  Underlying infiltrate in the right mid to lower lung zone and left base would be difficult to exclude in this setting.  The overall appearance is similar to that of the prior exam.  Prominent skin fold suspected.  No gross pneumothorax.  Cardiomegaly.  Calcified tortuous aorta.  IMPRESSION: Similar appearance of asymmetric air space disease as noted above.  Original Report Authenticated By: Fuller Canada, M.D.   Dg Chest Port 1 View  02/02/2011  *RADIOLOGY REPORT*  Clinical Data: CHF and shortness of breath.  PORTABLE CHEST - 1 VIEW  Comparison: 03/28/2010  Findings: Moderate pulmonary edema present with probable component of right pleural fluid.  Stable cardiomegaly.  IMPRESSION: Moderate pulmonary edema and probable right pleural effusion.  Original Report Authenticated By: Reola Calkins, M.D.    Lab  Results: Basic Metabolic Panel:  Basename 02/09/11 0545 02/08/11 0630  NA 137 134*  K 4.3 4.5  CL 102 103  CO2 23 22  GLUCOSE 188* 118*  BUN 101* 98*  CREATININE 3.38* 3.43*  CALCIUM 7.4* 7.4*  MG -- --  PHOS -- --   Liver Function Tests: No results found for this basename: AST:2,ALT:2,ALKPHOS:2,BILITOT:2,PROT:2,ALBUMIN:2 in the last 72 hours   CBC: No results found for this basename: WBC:2,NEUTROABS:2,HGB:2,HCT:2,MCV:2,PLT:2 in the last 72 hours  No results found for this or any previous visit (from the past 240 hour(s)).   Hospital Course:  [name] is an 77 year old gentleman with multiple chronic medical problems who was admitted on February 02, 2011 with increasing shortness of breath. He was found to be in pulmonary edema due to a non-ST elevation myocardial infarction. Despite aggressive medical therapy the patient continued to be fairly symptomatic. He was seen in consultation by [facility] and the patient decided to forego hemodialysis. His symptoms were managed conservatively and patient was doing pretty good on 80 mg of Lasix twice is twice a day. The patient elected to pursue hospice care at home, and he will be transferred with [facility] services home today.   Discharge Exam: Blood pressure 136/50, pulse 71, temperature 98.6 F (37 C), temperature source Oral, resp. rate 18, height 5\' 8"  (1.727 m), weight 47.6 kg (104 lb 15 oz), SpO2 94.00%. Alert and oriented x3 Chest with chronic rhonchi and crackles Heart with irregular rhythm Abdomen soft nontender  Disposition: Home with hospice    Follow-up Information    Follow up with TRIPP,HENRY. (As needed)    Contact information:   [address] Laurell Josephs. [address] [place] [address] [phone]          Signed: Lonia Blood 02/09/2011, 12:02 PM

## 2011-02-09 NOTE — Progress Notes (Signed)
SUBJECTIVE:  The patient is feeling much better today.  He denies any SOB.  OBJECTIVE:   Vitals:   Filed Vitals:   02/08/11 0519 02/08/11 1430 02/08/11 2225 02/09/11 0500  BP: 143/51 121/45 158/56 136/50  Pulse: 67 65 68 71  Temp: 99.4 F (37.4 C) 98.7 F (37.1 C) 98.4 F (36.9 C) 98.6 F (37 C)  TempSrc: Oral Oral    Resp: 20 20 20 18   Height:      Weight: 47.582 kg (104 lb 14.4 oz)   47.6 kg (104 lb 15 oz)  SpO2: 94% 96% 92% 94%   I&O's:   Intake/Output Summary (Last 24 hours) at 02/09/11 0827 Last data filed at 02/09/11 0641  Gross per 24 hour  Intake    720 ml  Output   1550 ml  Net   -830 ml   TELEMETRY: Reviewed telemetry pt in NSR:     PHYSICAL EXAM General: Well developed, well nourished, in no acute distress Head: Eyes PERRLA, No xanthomas.   Normal cephalic and atramatic  Lungs:   Clear bilaterally to auscultation and percussion. Heart:   HRRR S1 S2 Pulses are 2+ & equal.            No carotid bruit. No JVD.  No abdominal bruits. No femoral bruits. Abdomen: Bowel sounds are positive, abdomen soft and non-tender without masses Extremities:   No clubbing, cyanosis or edema.  DP +1 Neuro: Alert and oriented X 3. Psych:  Good affect, responds appropriately   LABS: Basic Metabolic Panel:  Basename 02/09/11 0545 02/08/11 0630  NA 137 134*  K 4.3 4.5  CL 102 103  CO2 23 22  GLUCOSE 188* 118*  BUN 101* 98*  CREATININE 3.38* 3.43*  CALCIUM 7.4* 7.4*  MG -- --  PHOS -- --   Coag Panel:   Lab Results  Component Value Date   INR 1.93* 02/09/2011   INR 2.62* 02/06/2011   INR 2.29* 02/04/2011    RADIOLOGY: Dg Chest Port 1 View  02/06/2011  *RADIOLOGY REPORT*  Clinical Data: Pulmonary edema. Shortness of breath.  PORTABLE CHEST - 1 VIEW  Comparison: 02/01/2011.  Findings: Asymmetric air space disease greater on the right may represent pulmonary edema with pleural effusions.  Underlying infiltrate in the right mid to lower lung zone and left base would be  difficult to exclude in this setting.  The overall appearance is similar to that of the prior exam.  Prominent skin fold suspected.  No gross pneumothorax.  Cardiomegaly.  Calcified tortuous aorta.  IMPRESSION: Similar appearance of asymmetric air space disease as noted above.  Original Report Authenticated By: Fuller Canada, M.D.   Dg Chest Port 1 View  02/02/2011  *RADIOLOGY REPORT*  Clinical Data: CHF and shortness of breath.  PORTABLE CHEST - 1 VIEW  Comparison: 03/28/2010  Findings: Moderate pulmonary edema present with probable component of right pleural fluid.  Stable cardiomegaly.  IMPRESSION: Moderate pulmonary edema and probable right pleural effusion.  Original Report Authenticated By: Reola Calkins, M.D.      ASSESSMENT:  1.  Acute diastolic heart failure on diuretis 2.  Chronic kidney disease stage IV 3.  DM  PLAN:   1.  Palliative care 2.  No new recs - will sign off.  Call with any questions.  [name], MD  02/09/2011  8:27 AM

## 2011-02-09 NOTE — Progress Notes (Signed)
Spoke to patient and wife, they would like [facility], faxed referral to [facility], Patient for discharge today.  Patient has equipment at home no new equipment needed.  NCM will continue to follow.

## 2011-02-09 NOTE — Consult Note (Signed)
ANTICOAGULATION CONSULT NOTE - Follow Up Consult  Pharmacy Consult for Coumadin Indication: History of PE and PAF  Allergies  Allergen Reactions  . Diltiazem Anaphylaxis  . Lisinopril     unkwown reaction  . Ampicillin Itching and Rash    Tolerates Rocephin  . Sulfa Antibiotics Rash    Patient Measurements: Height: 5\' 8"  (172.7 cm) Weight: 104 lb 15 oz (47.6 kg) (bed scale) IBW/kg (Calculated) : 68.4    Vital Signs: Temp: 98.6 F (37 C) (12/03 0500) BP: 136/50 mmHg (12/03 0500) Pulse Rate: 71  (12/03 0500)  Labs:  Basename 02/09/11 0545 02/08/11 0630 02/07/11 0500  HGB -- -- --  HCT -- -- --  PLT -- -- --  APTT -- -- --  LABPROT 22.4* -- --  INR 1.93* -- --  HEPARINUNFRC -- -- --  CREATININE 3.38* 3.43* 3.23*  CKTOTAL -- -- --  CKMB -- -- --  TROPONINI -- -- --   Estimated Creatinine Clearance: 10.8 ml/min (by C-G formula based on Cr of 3.38).   Medications:  Scheduled:    . furosemide  80 mg Oral BID  . insulin aspart  0-15 Units Subcutaneous TID WC  . insulin aspart  0-5 Units Subcutaneous QHS  . insulin glargine  15 Units Subcutaneous QHS  . isosorbide-hydrALAZINE  1 tablet Oral TID  . levothyroxine  125 mcg Oral Daily  . lipase/protease/amylase  1 capsule Oral TID WC  . loratadine  10 mg Oral Daily  . nebivolol  5 mg Oral Daily  . nystatin  5 mL Oral QID  . oxyCODONE  10 mg Oral BID  . pantoprazole  80 mg Oral Daily  . sodium chloride  3 mL Intravenous Q12H  . warfarin  1 mg Oral Custom  . warfarin  2 mg Oral Custom    Assessment: Patient is a 77 y/o male on chronic Coumadin with history of PAF and PE.  INR just subtherapeutic at 1.93.  No complications noted.  Goal of Therapy:  INR 2-3   Plan:  Coumadin 2.5mg  x 1 today, then resume home dose schedule.  Roshaunda Starkey, Elisha Headland, Pharm.D. 02/09/2011 11:34 AM

## 2011-02-09 NOTE — Progress Notes (Signed)
   CARE MANAGEMENT NOTE 02/09/2011  Patient:  William French, William French   Account Number:  [id number]  Date Initiated:  02/03/2011  Documentation initiated by:  Letha Cape  Subjective/Objective Assessment:   dx nstemi, chf  admit- lives with spouse. Patient has had HHRN with [facility] in the past. Patient may need snf.     Action/Plan:   Home with [facility].   Anticipated DC Date:  02/09/2011   Anticipated DC Plan:  HOME W HOSPICE CARE      DC Planning Services  CM consult      PAC Choice  HOSPICE   Choice offered to / List presented to:  C-1 Patient        HH arranged  HH-1 RN      [facility] agency  [facility]   Status of service:  Completed, signed off Medicare Important Message given?   (If response is "NO", the following Medicare IM given date fields will be blank) Date Medicare IM given:   Date Additional Medicare IM given:    Discharge Disposition:  HOME W HOSPICE CARE  Per UR Regulation:  Reviewed for med. necessity/level of care/duration of stay  Comments:  02/09/11 12:19 Letha Cape RN, BSN [phone] referral faxed to [facility], spoke with Amil Amen informed, they will go out to see patient today.  02/03/11 11:10 Lamees Gable RN, BSN [phone] patient lives with spouse, NCM will continue to follow for dc needs.

## 2011-02-15 ENCOUNTER — Inpatient Hospital Stay: Payer: Self-pay | Admitting: *Deleted

## 2011-03-04 IMAGING — CR DG CHEST 1V PORT
1 series · 1 of 1 positions shown · non-contrast
Comparison: 05/27/2009

CLINICAL DATA: Evaluate for pneumonia.  Shortness of breath.

PORTABLE CHEST - 1 VIEW

[AP]
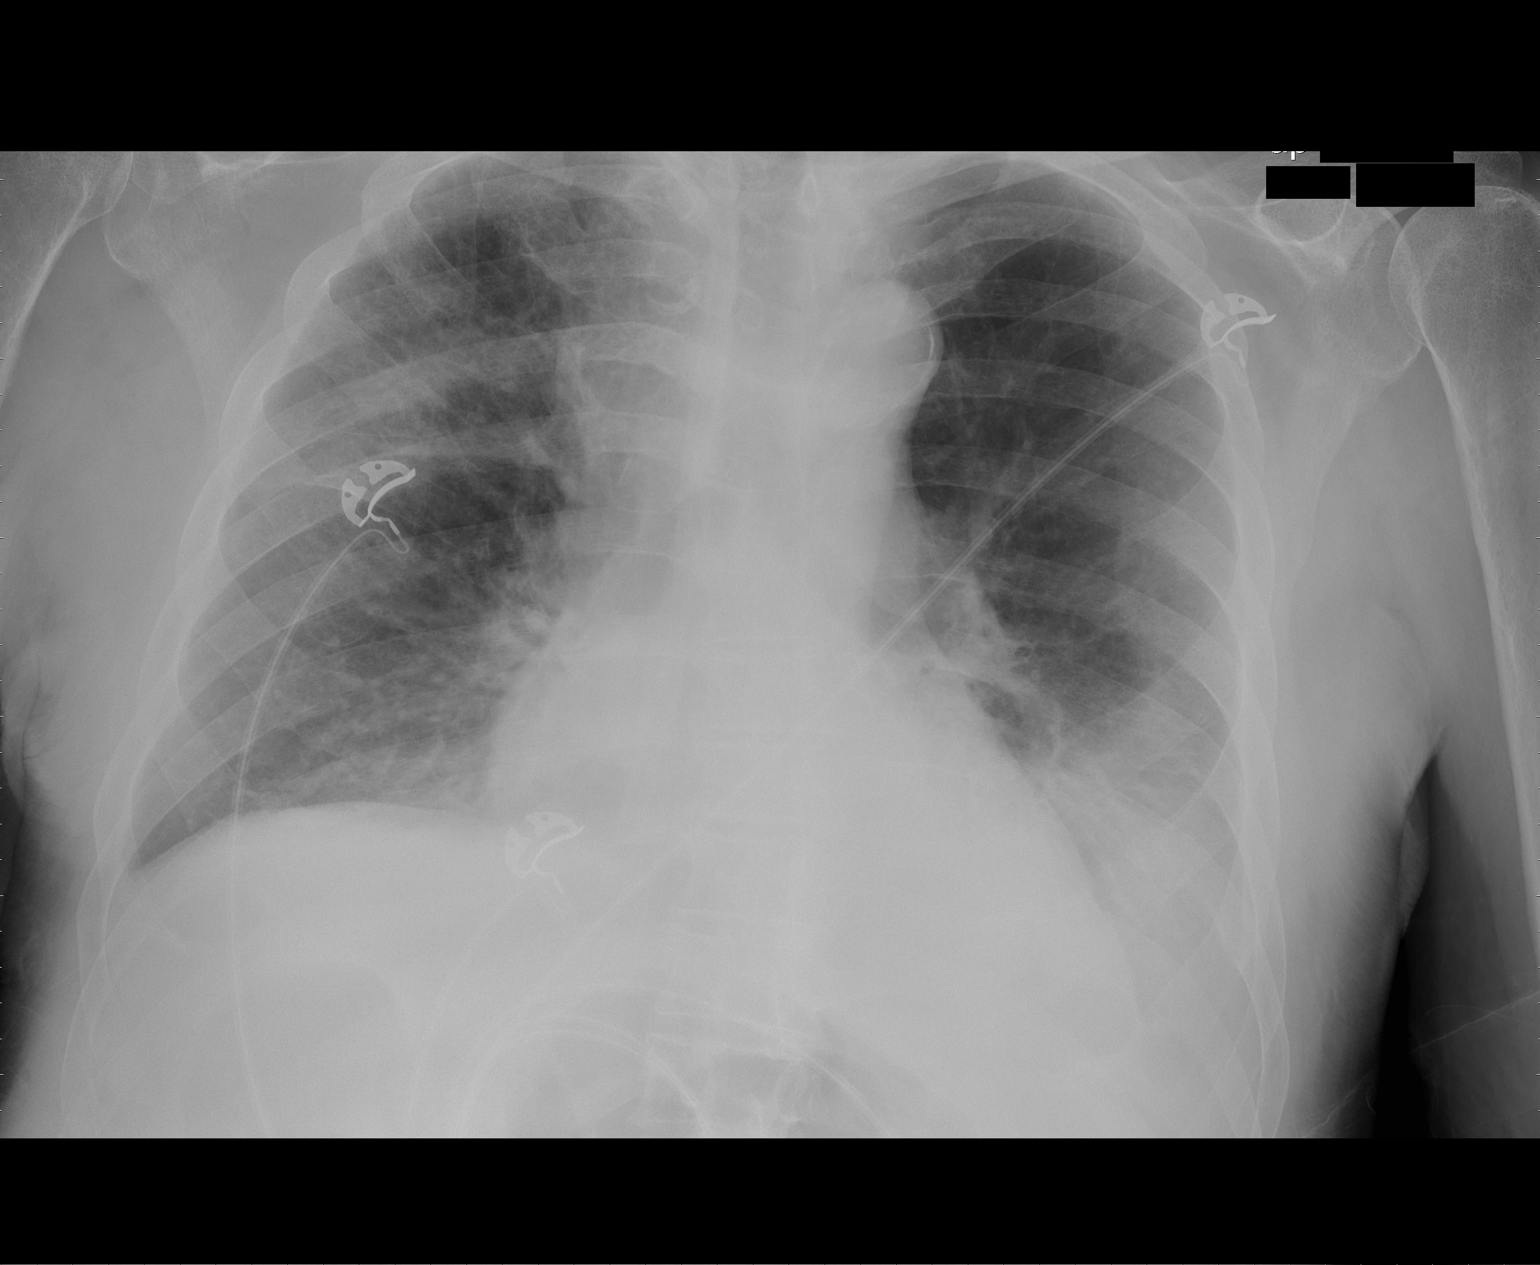

[1 of 1 positions shown; findings below may reference images not displayed]

FINDINGS: The heart size appears enlarged.

There has been interval improvement in pulmonary edema pattern.

Left pleural effusion persists.
IMPRESSION: 1.  Improving pulmonary edema pattern.
2.  Persistent left effusion.

## 2011-05-08 DEATH — deceased

## 2011-09-15 IMAGING — CR DG PELVIS 1-2V
1 series · 1 of 1 positions shown · non-contrast
Comparison: None

CLINICAL DATA: Fall with right hip pain.

PELVIS - 1-2 VIEW

[t pelvis a.p.]
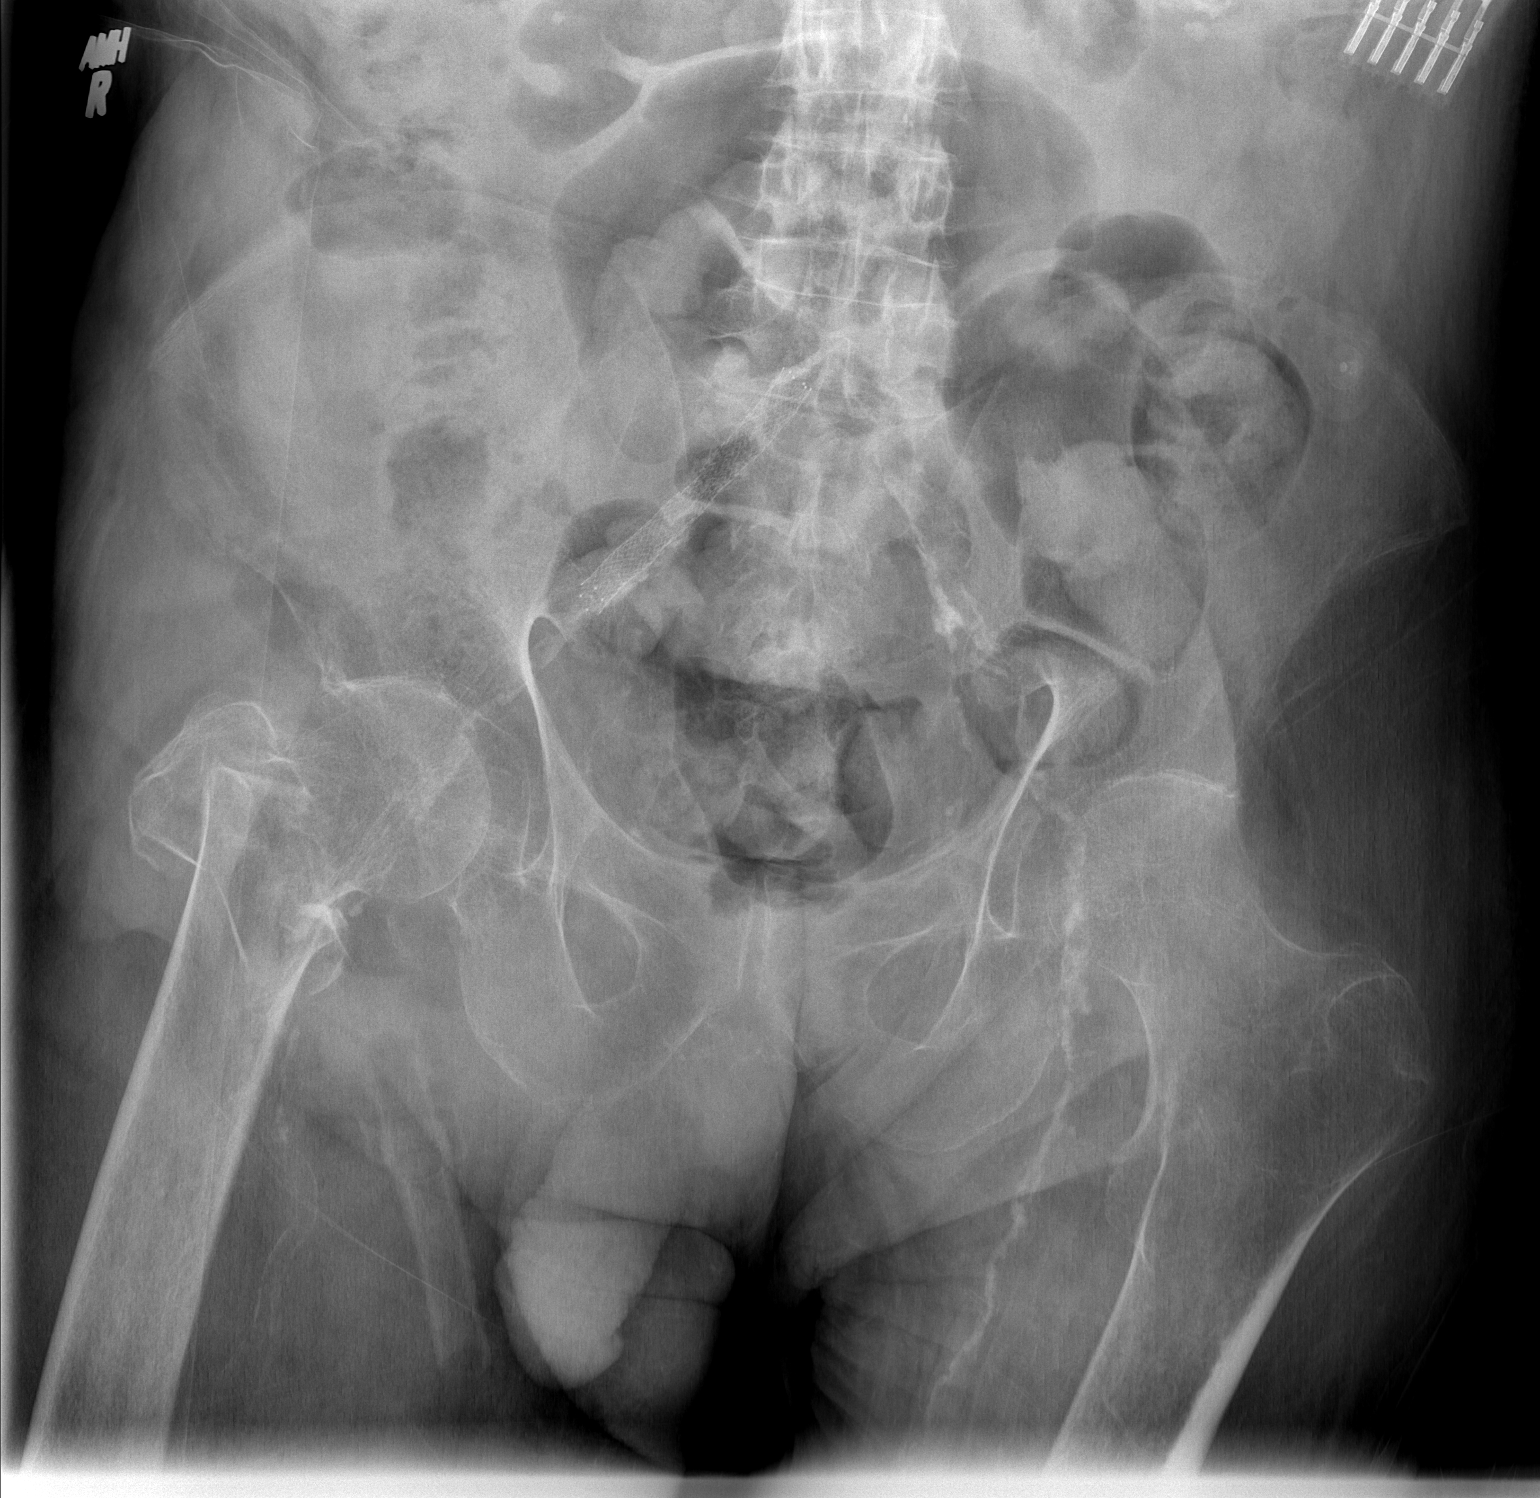

[1 of 1 positions shown; findings below may reference images not displayed]

FINDINGS: A mildly comminuted right intertrochanteric femur
fracture is identified.  There is no evidence of subluxation or
dislocation.
Diffuse osteopenia is present.
Right iliac stent is noted.
IMPRESSION: Mildly comminuted right intertrochanteric femur fracture.

## 2011-10-08 IMAGING — CR DG CHEST 2V
1 series · 1 of 1 positions shown · non-contrast
Comparison: 01/04/2010

CLINICAL DATA: Short of breath.  Pneumonia.

CHEST - 2 VIEW

[view not recorded]
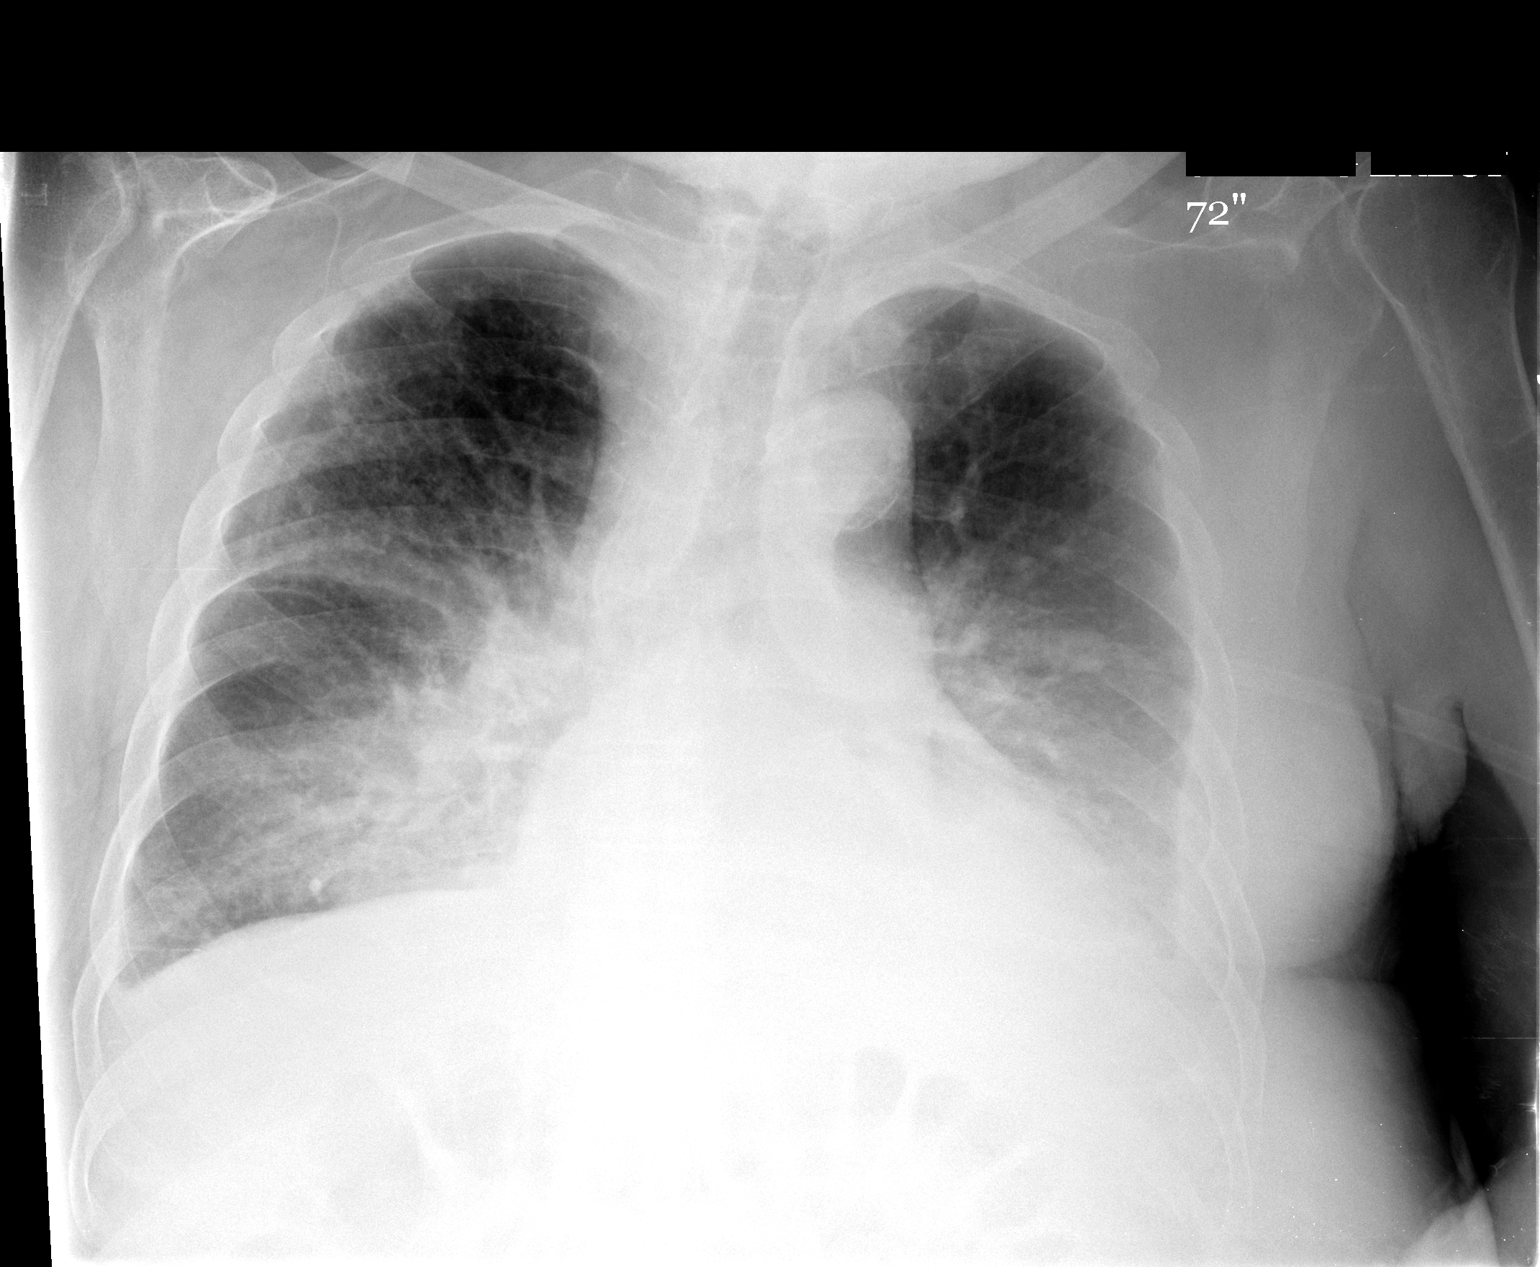

[1 of 1 positions shown; findings below may reference images not displayed]

FINDINGS: Perihilar infiltrates are unchanged.  Improvement in
bilateral effusions.  There is bibasilar airspace disease which may
be atelectasis due to the effusions.  Widening of the right
paratracheal soft tissues may be due to adenopathy or vascular
congestion due to fluid overload.
IMPRESSION: Bilateral airspace disease is similar to the prior study.  This is
most compatible with congestive heart failure with edema.
Superimposed pneumonia not excluded.

Mild improvement in bilateral pleural effusions.

## 2014-07-01 NOTE — Discharge Summary (Signed)
PATIENT NAME:  William French, William French MR#:  [id number] DATE OF BIRTH:  1923-09-10  DATE OF ADMISSION:  02/15/2011 DATE OF DISCHARGE:  02/19/2011  DISCHARGE DIAGNOSES:  1. Right parotitis. 2. Acute on chronic respiratory failure secondary to congestive heart failure exacerbation, suspect acute on chronic systolic failure.  3. Right-sided pneumonia. 4. Proteus urinary tract infection.  5. Chronic kidney disease, stage IV or V.  6. Atrial fibrillation, on Coumadin.  7. Diabetes.  8. Hypertension.  9. Status post urostomy. 10. Paraplegia. 11. Chronic pancreatitis. 12. Left BKA.   CONSULTANT: ENT, Dr. Roetta Sessions COURSE: This is an 81 year old male who has multiple medical problems. He is paraplegic status post left BKA. He has a urostomy, chronic respiratory failure, uses 2 to 2.5 liters of oxygen at home, history of congestive heart failure, diabetes, hypertension, and chronic pancreatitis. He presented with right-sided mandible pain and a mass. He also was complaining of shortness of breath. He was complaining of some cough also. He was admitted as neck mass, maybe parotitis, urinary tract infection, and acute on chronic respiratory failure secondary to congestive heart failure. At admission, he had a urinalysis done which showed 3+ leukocyte esterase and 75 WBCs per high-powered field. He was started on ceftriaxone and Zithromax. His chest x-ray showed he had a right lower lobe pneumonia with right pleural effusion with minimal right middle lobe involvement. He had a CT of the neck done which showed that the patient had findings compatible with mild subcutaneous edema within the anterior neck below the mandible without evidence of angioneurotic edema. Bilateral large pleural effusions and bilateral pulmonary fibrosis, and mediastinal adenopathy. The patient was started on IV ceftriaxone and Zithromax to cover pneumonia, urinary tract infection. He was started on IV Lasix for congestive heart  failure exacerbation. The patient was recently discharged from [facility] where he had an echo. The patient cannot get ACE or angiotensin receptor blocker because of his severe chronic kidney disease, almost end-stage renal disease. When he came in, his creatinine was 3.68, BUN 92, and a GFR of around 17. At discharge, his creatinine was 2.92, BUN 79 with a GFR of around 22 making it CKD, stage IV. Dr. Elenore Rota of ENT saw him for his right neck mass and he thinks it was parotitis and he suggested to use sour candy and speech evaluation for his dysphagia and continue the antibiotics. Speech saw him and he is on a dysphagia I pureed diet with nectar thickened liquids and speech therapy will be arranged for home also. His IV Lasix was transitioned to p.o. Lasix. Initially he was on 5 to 6 liters of nasal cannula. Now he is on 2.5 liters of nasal cannula around 92%. His chest is clear. When he came in, his white count was elevated at 14.6, hemoglobin 8.9. His white count has normalized now with a white count of 10.4. He also was anemic. He had a hemoglobin of 8.9. It dropped to 7.6. He most likely has anemia of severe chronic kidney disease. He received 1 unit of packed RBC transfusion and his hemoglobin has remained stable. His TIBC is low at 124 and ferritin is high with iron saturation 11%. Most likely he has anemia of chronic kidney disease. His hemoglobin has been stable in the range of 9 to 8.7. When he came in, his INR was around 3.3 to 3. His Coumadin was stopped but I will restart his Coumadin at a lower dose at 1 mg daily. He is  also on Levaquin. That will also raise warfarin levels. I will decrease his Coumadin to 1 mg daily and he should have his INR checked probably on Monday. Coumadin dose should be adjusted accordingly.   MEDICATIONS AT DISCHARGE (HOME MEDICATIONS):  1. BiDil 3 times a day.  2. Nystatin 5 mL 4 times a day. 3. Lasix 40 mg 2 tablets twice a day. 4. Insulin glargine 10 units  subcutaneous at bedtime. 5. Cetirizine 10 mg daily.  6. Norco 5/325 q.6 hours as needed.  7. Regular insulin 3 times a day before meals.  8. Levothyroxine 0.125 mg daily.  9. Nebivolol 5 mg daily.  10. Nitroglycerin q.5 minutes as needed. 11. Oxycodone 10 mg q.12 hours as needed.  12. Pancrease 1 tablet once a day. 13. Protonix 40 mg daily.  14. Levaquin 250 mg p.o. once daily for five days.   NEW MEDICATIONS: 1. Change warfarin to 1 mg p.o. once daily. 2. Tessalon Perles 100 mg p.o. q.8 hours as needed for cough.   CONDITION AT DISCHARGE: Guarded. T-max 98.5, heart rate ranging from 59 to 74, blood pressure 151/62, saturating 92% on 2.5 liters and 94% on 3 liters nasal cannula.   HOME OXYGEN: 3 liters nasal cannula.   DRESSING CARE: Urostomy care.   DIET: Advised a low sodium, ADA, dysphagia I diet, pureed nectar thickened liquids. No straws.   ACTIVITY: As tolerated.   FOLLOWUP: Follow-up with Dr. Florentina JennyHenry Tripp with [facility] early next week.   DISCHARGE DISPOSITION: [facility] with speech therapy, RN. Follow-up INR on Monday with results to be sent to Dr. Redmond Schoolripp. Follow-up with Dr. Elenore RotaJuengel of ENT if needed.   TIME SPENT ON DISCHARGE: 60 minutes.   ____________________________ Fredia SorrowAbhinav Araina Butrick, MD ag:drc D: 02/19/2011 13:27:01 ET T: 02/19/2011 14:05:00 ET JOB#: [id number]  cc: Fredia SorrowAbhinav Kolston Lacount, MD, <Dictator> Fredia SorrowABHINAV Yazmyn Valbuena MD ELECTRONICALLY SIGNED 03/20/2011 12:04
